# Patient Record
Sex: Female | Born: 1950
Health system: Southern US, Community
[De-identification: ages and names within clinical notes are randomized; demographics above are authoritative.]

## PROBLEM LIST (undated history)

## (undated) DIAGNOSIS — D649 Anemia, unspecified: Secondary | ICD-10-CM

## (undated) DIAGNOSIS — C801 Malignant (primary) neoplasm, unspecified: Secondary | ICD-10-CM

## (undated) DIAGNOSIS — M25559 Pain in unspecified hip: Secondary | ICD-10-CM

## (undated) DIAGNOSIS — J45909 Unspecified asthma, uncomplicated: Secondary | ICD-10-CM

## (undated) DIAGNOSIS — M1990 Unspecified osteoarthritis, unspecified site: Secondary | ICD-10-CM

## (undated) DIAGNOSIS — R32 Unspecified urinary incontinence: Secondary | ICD-10-CM

## (undated) DIAGNOSIS — F419 Anxiety disorder, unspecified: Secondary | ICD-10-CM

## (undated) DIAGNOSIS — I10 Essential (primary) hypertension: Secondary | ICD-10-CM

## (undated) DIAGNOSIS — K219 Gastro-esophageal reflux disease without esophagitis: Secondary | ICD-10-CM

## (undated) HISTORY — PX: OTHER SURGICAL HISTORY: SHX169

## (undated) HISTORY — PX: BUNIONECTOMY: SHX129

## (undated) HISTORY — PX: CARPAL TUNNEL RELEASE: SHX101

## (undated) HISTORY — PX: ABDOMINAL HYSTERECTOMY: SHX81

## (undated) HISTORY — PX: KIDNEY SURGERY: SHX687

## (undated) HISTORY — PX: KIDNEY STONE SURGERY: SHX686

---

## 2004-08-26 ENCOUNTER — Ambulatory Visit: Payer: Self-pay | Admitting: Internal Medicine

## 2005-10-27 ENCOUNTER — Emergency Department: Payer: Self-pay | Admitting: General Practice

## 2006-01-05 ENCOUNTER — Ambulatory Visit: Payer: Self-pay | Admitting: Family Medicine

## 2006-03-12 ENCOUNTER — Emergency Department: Payer: Self-pay | Admitting: General Practice

## 2006-03-12 ENCOUNTER — Other Ambulatory Visit: Payer: Self-pay

## 2007-01-29 ENCOUNTER — Ambulatory Visit: Payer: Self-pay | Admitting: Family Medicine

## 2007-02-08 ENCOUNTER — Ambulatory Visit: Payer: Self-pay | Admitting: Orthopaedic Surgery

## 2007-05-18 ENCOUNTER — Emergency Department: Payer: Self-pay | Admitting: Emergency Medicine

## 2008-03-19 ENCOUNTER — Ambulatory Visit: Payer: Self-pay | Admitting: Physician Assistant

## 2008-12-28 ENCOUNTER — Emergency Department: Payer: Self-pay | Admitting: Emergency Medicine

## 2009-01-24 DIAGNOSIS — C801 Malignant (primary) neoplasm, unspecified: Secondary | ICD-10-CM

## 2009-01-24 HISTORY — DX: Malignant (primary) neoplasm, unspecified: C80.1

## 2009-03-26 ENCOUNTER — Ambulatory Visit: Payer: Self-pay | Admitting: General Practice

## 2010-03-31 ENCOUNTER — Ambulatory Visit: Payer: Self-pay | Admitting: General Practice

## 2011-01-23 ENCOUNTER — Emergency Department: Payer: Self-pay | Admitting: Emergency Medicine

## 2011-02-11 ENCOUNTER — Inpatient Hospital Stay: Payer: Self-pay | Admitting: Internal Medicine

## 2011-02-11 LAB — CBC
HGB: 13.5 g/dL (ref 12.0–16.0)
RBC: 5.43 10*6/uL — ABNORMAL HIGH (ref 3.80–5.20)

## 2011-02-11 LAB — COMPREHENSIVE METABOLIC PANEL
Chloride: 97 mmol/L — ABNORMAL LOW (ref 98–107)
Co2: 27 mmol/L (ref 21–32)
EGFR (African American): 36 — ABNORMAL LOW
EGFR (Non-African Amer.): 30 — ABNORMAL LOW
Osmolality: 279 (ref 275–301)
SGOT(AST): 409 U/L — ABNORMAL HIGH (ref 15–37)
SGPT (ALT): 256 U/L — ABNORMAL HIGH

## 2011-02-11 LAB — LIPASE, BLOOD: Lipase: >3000 U/L (ref 73–393)

## 2011-02-11 LAB — CK TOTAL AND CKMB (NOT AT ARMC)
CK, Total: 466 U/L — ABNORMAL HIGH (ref 21–215)
CK-MB: 1.5 ng/mL (ref 0.5–3.6)

## 2011-02-11 LAB — MAGNESIUM: Magnesium: 1.5 mg/dL — ABNORMAL LOW

## 2011-02-12 LAB — LIPASE, BLOOD: Lipase: >3000 U/L (ref 73–393)

## 2011-02-12 LAB — COMPREHENSIVE METABOLIC PANEL
Alkaline Phosphatase: 77 U/L (ref 50–136)
BUN: 22 mg/dL — ABNORMAL HIGH (ref 7–18)
Bilirubin,Total: 1 mg/dL (ref 0.2–1.0)
Calcium, Total: 8.8 mg/dL (ref 8.5–10.1)
Chloride: 102 mmol/L (ref 98–107)
Creatinine: 1.27 mg/dL (ref 0.60–1.30)
EGFR (African American): 55 — ABNORMAL LOW
Glucose: 130 mg/dL — ABNORMAL HIGH (ref 65–99)
Osmolality: 279 (ref 275–301)
SGPT (ALT): 958 U/L — ABNORMAL HIGH
Sodium: 137 mmol/L (ref 136–145)
Total Protein: 7.1 g/dL (ref 6.4–8.2)

## 2011-02-12 LAB — CBC WITH DIFFERENTIAL/PLATELET
Basophil #: 0 10*3/uL (ref 0.0–0.1)
Basophil %: 0.3 %
Eosinophil #: 0 10*3/uL (ref 0.0–0.7)
Eosinophil %: 0.1 %
Lymphocyte #: 0.8 10*3/uL — ABNORMAL LOW (ref 1.0–3.6)
Lymphocyte %: 10.3 %
MCH: 25.1 pg — ABNORMAL LOW (ref 26.0–34.0)
MCHC: 31.9 g/dL — ABNORMAL LOW (ref 32.0–36.0)
MCV: 79 fL — ABNORMAL LOW (ref 80–100)
Monocyte #: 0.8 10*3/uL — ABNORMAL HIGH (ref 0.0–0.7)
Platelet: 211 10*3/uL (ref 150–440)
RBC: 5.14 10*6/uL (ref 3.80–5.20)

## 2011-02-12 LAB — LIPID PANEL
Cholesterol: 159 mg/dL (ref 0–200)
Ldl Cholesterol, Calc: 107 mg/dL — ABNORMAL HIGH (ref 0–100)
VLDL Cholesterol, Calc: 18 mg/dL (ref 5–40)

## 2011-02-12 LAB — HEMOGLOBIN A1C: Hemoglobin A1C: 6.6 % — ABNORMAL HIGH (ref 4.2–6.3)

## 2011-02-13 LAB — BASIC METABOLIC PANEL
Anion Gap: 9 (ref 7–16)
BUN: 17 mg/dL (ref 7–18)
Calcium, Total: 8.3 mg/dL — ABNORMAL LOW (ref 8.5–10.1)
Chloride: 104 mmol/L (ref 98–107)
Co2: 26 mmol/L (ref 21–32)
Creatinine: 1.14 mg/dL (ref 0.60–1.30)
EGFR (African American): >60
EGFR (Non-African Amer.): 51 — ABNORMAL LOW
Glucose: 99 mg/dL (ref 65–99)
Osmolality: 279 (ref 275–301)
Sodium: 139 mmol/L (ref 136–145)

## 2011-02-13 LAB — CBC WITH DIFFERENTIAL/PLATELET
Basophil #: 0 10*3/uL (ref 0.0–0.1)
Basophil %: 0 %
Eosinophil #: 0 10*3/uL (ref 0.0–0.7)
HCT: 35.5 % (ref 35.0–47.0)
HGB: 11.5 g/dL — ABNORMAL LOW (ref 12.0–16.0)
Lymphocyte %: 8.5 %
MCHC: 32.3 g/dL (ref 32.0–36.0)
Monocyte %: 12.1 %
Neutrophil #: 6.5 10*3/uL (ref 1.4–6.5)
Neutrophil %: 79.4 %
Platelet: 195 10*3/uL (ref 150–440)
RDW: 14.2 % (ref 11.5–14.5)
WBC: 8.2 10*3/uL (ref 3.6–11.0)

## 2011-02-13 LAB — MAGNESIUM: Magnesium: 2.4 mg/dL

## 2011-02-13 LAB — HEPATIC FUNCTION PANEL A (ARMC)
Bilirubin,Total: 2 mg/dL — ABNORMAL HIGH (ref 0.2–1.0)
SGOT(AST): 501 U/L — ABNORMAL HIGH (ref 15–37)
Total Protein: 6.4 g/dL (ref 6.4–8.2)

## 2011-02-13 LAB — LIPASE, BLOOD: Lipase: 1748 U/L — ABNORMAL HIGH (ref 73–393)

## 2011-02-14 LAB — BASIC METABOLIC PANEL
BUN: 17 mg/dL (ref 7–18)
Co2: 24 mmol/L (ref 21–32)
Creatinine: 1.23 mg/dL (ref 0.60–1.30)
EGFR (African American): 57 — ABNORMAL LOW
EGFR (Non-African Amer.): 47 — ABNORMAL LOW
Potassium: 4.2 mmol/L (ref 3.5–5.1)
Sodium: 137 mmol/L (ref 136–145)

## 2011-02-14 LAB — HEPATIC FUNCTION PANEL A (ARMC)
Albumin: 2.6 g/dL — ABNORMAL LOW (ref 3.4–5.0)
Bilirubin, Direct: 1.6 mg/dL — ABNORMAL HIGH (ref 0.00–0.20)
Total Protein: 5.8 g/dL — ABNORMAL LOW (ref 6.4–8.2)

## 2011-02-14 LAB — CBC WITH DIFFERENTIAL/PLATELET
Basophil %: 0.2 %
Eosinophil #: 0 10*3/uL (ref 0.0–0.7)
Eosinophil %: 0.1 %
HCT: 28.9 % — ABNORMAL LOW (ref 35.0–47.0)
Lymphocyte #: 0.9 10*3/uL — ABNORMAL LOW (ref 1.0–3.6)
MCH: 25.6 pg — ABNORMAL LOW (ref 26.0–34.0)
MCHC: 32.5 g/dL (ref 32.0–36.0)
Neutrophil %: 76 %
Platelet: 166 10*3/uL (ref 150–440)
RBC: 3.66 10*6/uL — ABNORMAL LOW (ref 3.80–5.20)
RDW: 14 % (ref 11.5–14.5)
WBC: 9.3 10*3/uL (ref 3.6–11.0)

## 2011-02-15 LAB — HEPATIC FUNCTION PANEL A (ARMC)
Albumin: 2.3 g/dL — ABNORMAL LOW (ref 3.4–5.0)
Alkaline Phosphatase: 86 U/L (ref 50–136)
Bilirubin,Total: 1.1 mg/dL — ABNORMAL HIGH (ref 0.2–1.0)
SGOT(AST): 79 U/L — ABNORMAL HIGH (ref 15–37)
SGPT (ALT): 235 U/L — ABNORMAL HIGH
Total Protein: 5.7 g/dL — ABNORMAL LOW (ref 6.4–8.2)

## 2011-02-16 LAB — IRON AND TIBC
Iron Bind.Cap.(Total): 158 ug/dL — ABNORMAL LOW
Iron Saturation: 15 %
Iron: 24 ug/dL — ABNORMAL LOW
Unbound Iron-Bind.Cap.: 134 ug/dL

## 2011-02-16 LAB — CBC WITH DIFFERENTIAL/PLATELET
Basophil #: 0 10*3/uL (ref 0.0–0.1)
Eosinophil #: 0.2 10*3/uL (ref 0.0–0.7)
HCT: 24.8 % — ABNORMAL LOW (ref 35.0–47.0)
HGB: 8.1 g/dL — ABNORMAL LOW (ref 12.0–16.0)
Lymphocyte #: 1 10*3/uL (ref 1.0–3.6)
MCH: 25.3 pg — ABNORMAL LOW (ref 26.0–34.0)
MCHC: 32.6 g/dL (ref 32.0–36.0)
MCV: 78 fL — ABNORMAL LOW (ref 80–100)
Monocyte #: 1.1 10*3/uL — ABNORMAL HIGH (ref 0.0–0.7)
Neutrophil #: 9.2 10*3/uL — ABNORMAL HIGH (ref 1.4–6.5)
RBC: 3.19 10*6/uL — ABNORMAL LOW (ref 3.80–5.20)
RDW: 13.7 % (ref 11.5–14.5)
WBC: 11.5 10*3/uL — ABNORMAL HIGH (ref 3.6–11.0)

## 2011-02-16 LAB — HEPATIC FUNCTION PANEL A (ARMC)
Albumin: 2.2 g/dL — ABNORMAL LOW (ref 3.4–5.0)
Alkaline Phosphatase: 98 U/L (ref 50–136)
Bilirubin, Direct: 0.4 mg/dL — ABNORMAL HIGH (ref 0.00–0.20)
SGOT(AST): 55 U/L — ABNORMAL HIGH (ref 15–37)

## 2011-02-16 LAB — FERRITIN: Ferritin (ARMC): 1876 ng/mL — ABNORMAL HIGH

## 2011-02-17 LAB — BASIC METABOLIC PANEL
Chloride: 101 mmol/L (ref 98–107)
Co2: 26 mmol/L (ref 21–32)
EGFR (African American): >60

## 2011-02-17 LAB — HEPATIC FUNCTION PANEL A (ARMC)
Albumin: 2.2 g/dL — ABNORMAL LOW (ref 3.4–5.0)
Alkaline Phosphatase: 86 U/L (ref 50–136)
Bilirubin,Total: 0.6 mg/dL (ref 0.2–1.0)
SGOT(AST): 43 U/L — ABNORMAL HIGH (ref 15–37)
SGPT (ALT): 127 U/L — ABNORMAL HIGH

## 2011-02-17 LAB — CBC WITH DIFFERENTIAL/PLATELET
Basophil %: 0.2 %
Eosinophil %: 1.1 %
HGB: 8 g/dL — ABNORMAL LOW (ref 12.0–16.0)
Lymphocyte #: 1.1 10*3/uL (ref 1.0–3.6)
MCH: 25.4 pg — ABNORMAL LOW (ref 26.0–34.0)
MCV: 77 fL — ABNORMAL LOW (ref 80–100)
Monocyte #: 1.4 10*3/uL — ABNORMAL HIGH (ref 0.0–0.7)
Monocyte %: 10.7 %
Neutrophil #: 10 10*3/uL — ABNORMAL HIGH (ref 1.4–6.5)
Neutrophil %: 79.6 %
RBC: 3.13 10*6/uL — ABNORMAL LOW (ref 3.80–5.20)

## 2011-02-17 LAB — URINALYSIS, COMPLETE
Glucose,UR: NEGATIVE mg/dL (ref 0–75)
Ketone: NEGATIVE
Leukocyte Esterase: NEGATIVE
Nitrite: NEGATIVE
Protein: NEGATIVE
RBC,UR: 1 /HPF (ref 0–5)
WBC UR: 1 /HPF (ref 0–5)

## 2011-02-17 LAB — LIPASE, BLOOD: Lipase: 1085 U/L — ABNORMAL HIGH (ref 73–393)

## 2011-02-17 LAB — URINE CULTURE

## 2011-02-18 LAB — IRON AND TIBC
Iron Bind.Cap.(Total): 138 ug/dL — ABNORMAL LOW (ref 250–450)
Iron Saturation: 15 %
Unbound Iron-Bind.Cap.: 117 ug/dL

## 2011-02-18 LAB — CBC WITH DIFFERENTIAL/PLATELET
Basophil #: 0 10*3/uL (ref 0.0–0.1)
Basophil %: 0.2 %
Eosinophil #: 0.3 10*3/uL (ref 0.0–0.7)
Eosinophil %: 2.2 %
HGB: 7.8 g/dL — ABNORMAL LOW (ref 12.0–16.0)
Lymphocyte #: 1.2 10*3/uL (ref 1.0–3.6)
MCH: 25.5 pg — ABNORMAL LOW (ref 26.0–34.0)
MCHC: 32.7 g/dL (ref 32.0–36.0)
MCV: 78 fL — ABNORMAL LOW (ref 80–100)
Neutrophil #: 9.1 10*3/uL — ABNORMAL HIGH (ref 1.4–6.5)
Neutrophil %: 76.6 %
RBC: 3.05 10*6/uL — ABNORMAL LOW (ref 3.80–5.20)
RDW: 14 % (ref 11.5–14.5)

## 2011-02-18 LAB — RETICULOCYTES: Absolute Retic Count: 0.101 10*6/uL — ABNORMAL HIGH (ref 0.024–0.084)

## 2011-02-18 LAB — FERRITIN: Ferritin (ARMC): 1465 ng/mL — ABNORMAL HIGH (ref 8–388)

## 2011-02-18 LAB — AFP TUMOR MARKER: AFP-Tumor Marker: 1.2 ng/mL (ref 0.0–8.3)

## 2011-02-18 LAB — CANCER ANTIGEN 19-9: CA 19-9: 96 U/mL — ABNORMAL HIGH (ref 0–35)

## 2011-02-18 LAB — HEMOGLOBIN: HGB: 8.2 g/dL — ABNORMAL LOW (ref 12.0–16.0)

## 2011-02-18 LAB — FOLATE: Folic Acid: 14.9 ng/mL (ref 3.1–100.0)

## 2011-02-19 LAB — CBC WITH DIFFERENTIAL/PLATELET
Basophil #: 0 10*3/uL (ref 0.0–0.1)
Eosinophil %: 1.5 %
HCT: 24.1 % — ABNORMAL LOW (ref 35.0–47.0)
Lymphocyte #: 1.1 10*3/uL (ref 1.0–3.6)
Lymphocyte %: 9.5 %
MCV: 78 fL — ABNORMAL LOW (ref 80–100)
Monocyte #: 1.1 10*3/uL — ABNORMAL HIGH (ref 0.0–0.7)
Monocyte %: 9.5 %
Neutrophil #: 9.4 10*3/uL — ABNORMAL HIGH (ref 1.4–6.5)
RBC: 3.08 10*6/uL — ABNORMAL LOW (ref 3.80–5.20)
RDW: 13.8 % (ref 11.5–14.5)
WBC: 11.9 10*3/uL — ABNORMAL HIGH (ref 3.6–11.0)

## 2011-02-19 LAB — LIPASE, BLOOD: Lipase: 808 U/L — ABNORMAL HIGH (ref 73–393)

## 2011-02-20 LAB — CBC WITH DIFFERENTIAL/PLATELET
Basophil %: 0.1 %
Eosinophil %: 1.4 %
HCT: 29.1 % — ABNORMAL LOW (ref 35.0–47.0)
HGB: 9.4 g/dL — ABNORMAL LOW (ref 12.0–16.0)
Lymphocyte #: 1.2 10*3/uL (ref 1.0–3.6)
Lymphocyte %: 10.2 %
MCH: 26 pg (ref 26.0–34.0)
MCV: 81 fL (ref 80–100)
Monocyte #: 0.8 10*3/uL — ABNORMAL HIGH (ref 0.0–0.7)
Monocyte %: 6.8 %
Neutrophil %: 81.5 %
Platelet: 291 10*3/uL (ref 150–440)
RBC: 3.62 10*6/uL — ABNORMAL LOW (ref 3.80–5.20)
WBC: 11.4 10*3/uL — ABNORMAL HIGH (ref 3.6–11.0)

## 2011-02-20 LAB — COMPREHENSIVE METABOLIC PANEL
Anion Gap: 10 (ref 7–16)
Bilirubin,Total: 0.5 mg/dL (ref 0.2–1.0)
Chloride: 103 mmol/L (ref 98–107)
Co2: 28 mmol/L (ref 21–32)
Creatinine: 0.95 mg/dL (ref 0.60–1.30)
EGFR (African American): >60
EGFR (Non-African Amer.): >60
Osmolality: 280 (ref 275–301)
Potassium: 3.3 mmol/L — ABNORMAL LOW (ref 3.5–5.1)
SGOT(AST): 46 U/L — ABNORMAL HIGH (ref 15–37)
SGPT (ALT): 77 U/L
Total Protein: 5.9 g/dL — ABNORMAL LOW (ref 6.4–8.2)

## 2011-02-20 LAB — LIPASE, BLOOD: Lipase: 1046 U/L — ABNORMAL HIGH (ref 73–393)

## 2011-02-21 LAB — CULTURE, BLOOD (SINGLE)

## 2011-02-22 LAB — CULTURE, BLOOD (SINGLE)

## 2011-03-02 ENCOUNTER — Inpatient Hospital Stay: Payer: Self-pay | Admitting: Internal Medicine

## 2011-03-02 LAB — URINALYSIS, COMPLETE
Bilirubin,UR: NEGATIVE
Glucose,UR: NEGATIVE mg/dL (ref 0–75)
Ketone: NEGATIVE
Ph: 5 (ref 4.5–8.0)
RBC,UR: 4 /HPF (ref 0–5)
Specific Gravity: 1.014 (ref 1.003–1.030)
Squamous Epithelial: <1

## 2011-03-02 LAB — CK TOTAL AND CKMB (NOT AT ARMC)
CK, Total: 138 U/L (ref 21–215)
CK, Total: 134 U/L (ref 21–215)
CK-MB: 0.6 ng/mL (ref 0.5–3.6)
CK-MB: 0.5 ng/mL (ref 0.5–3.6)

## 2011-03-02 LAB — COMPREHENSIVE METABOLIC PANEL
Anion Gap: 11 (ref 7–16)
BUN: 15 mg/dL (ref 7–18)
Bilirubin,Total: 1.2 mg/dL — ABNORMAL HIGH (ref 0.2–1.0)
Calcium, Total: 10 mg/dL (ref 8.5–10.1)
Creatinine: 1.32 mg/dL — ABNORMAL HIGH (ref 0.60–1.30)
EGFR (African American): 53 — ABNORMAL LOW
EGFR (Non-African Amer.): 43 — ABNORMAL LOW
Glucose: 134 mg/dL — ABNORMAL HIGH (ref 65–99)
Potassium: 4.5 mmol/L (ref 3.5–5.1)
SGPT (ALT): 248 U/L — ABNORMAL HIGH
Sodium: 140 mmol/L (ref 136–145)
Total Protein: 7.5 g/dL (ref 6.4–8.2)

## 2011-03-02 LAB — PROTIME-INR
INR: 1
Prothrombin Time: 13.8 secs (ref 11.5–14.7)

## 2011-03-02 LAB — CBC
HCT: 37.4 % (ref 35.0–47.0)
HGB: 11.9 g/dL — ABNORMAL LOW (ref 12.0–16.0)
MCH: 25.9 pg — ABNORMAL LOW (ref 26.0–34.0)
MCHC: 31.9 g/dL — ABNORMAL LOW (ref 32.0–36.0)
RBC: 4.61 10*6/uL (ref 3.80–5.20)
WBC: 13.2 10*3/uL — ABNORMAL HIGH (ref 3.6–11.0)

## 2011-03-02 LAB — APTT: Activated PTT: 24.9 secs (ref 23.6–35.9)

## 2011-03-02 LAB — TROPONIN I: Troponin-I: <0.02 ng/mL

## 2011-03-03 LAB — CBC WITH DIFFERENTIAL/PLATELET
Basophil #: 0 10*3/uL (ref 0.0–0.1)
Basophil %: 0.6 %
Eosinophil #: 0.1 10*3/uL (ref 0.0–0.7)
HCT: 32.7 % — ABNORMAL LOW (ref 35.0–47.0)
HGB: 10.6 g/dL — ABNORMAL LOW (ref 12.0–16.0)
Lymphocyte #: 0.7 10*3/uL — ABNORMAL LOW (ref 1.0–3.6)
Lymphocyte %: 8.9 %
MCH: 25.9 pg — ABNORMAL LOW (ref 26.0–34.0)
MCHC: 32.4 g/dL (ref 32.0–36.0)
MCV: 80 fL (ref 80–100)
Monocyte #: 0.7 10*3/uL (ref 0.0–0.7)
Monocyte %: 10 %
Neutrophil #: 5.8 10*3/uL (ref 1.4–6.5)
Neutrophil %: 78.6 %
Platelet: 309 10*3/uL (ref 150–440)
RBC: 4.09 10*6/uL (ref 3.80–5.20)
RDW: 16.4 % — ABNORMAL HIGH (ref 11.5–14.5)

## 2011-03-03 LAB — LIPASE, BLOOD: Lipase: >3000 U/L (ref 73–393)

## 2011-03-03 LAB — CK TOTAL AND CKMB (NOT AT ARMC): CK, Total: 106 U/L (ref 21–215)

## 2011-03-03 LAB — MAGNESIUM: Magnesium: 1.7 mg/dL — ABNORMAL LOW

## 2011-03-04 LAB — COMPREHENSIVE METABOLIC PANEL
Alkaline Phosphatase: 189 U/L — ABNORMAL HIGH (ref 50–136)
BUN: 16 mg/dL (ref 7–18)
Calcium, Total: 9.5 mg/dL (ref 8.5–10.1)
Chloride: 106 mmol/L (ref 98–107)
Co2: 27 mmol/L (ref 21–32)
EGFR (African American): 54 — ABNORMAL LOW
EGFR (Non-African Amer.): 45 — ABNORMAL LOW
Potassium: 4.1 mmol/L (ref 3.5–5.1)
SGOT(AST): 435 U/L — ABNORMAL HIGH (ref 15–37)
SGPT (ALT): 468 U/L — ABNORMAL HIGH
Total Protein: 6.6 g/dL (ref 6.4–8.2)

## 2011-03-04 LAB — CBC WITH DIFFERENTIAL/PLATELET
Basophil %: 0.6 %
Eosinophil %: 6.2 %
HCT: 31.3 % — ABNORMAL LOW (ref 35.0–47.0)
HGB: 10.1 g/dL — ABNORMAL LOW (ref 12.0–16.0)
Lymphocyte #: 0.9 10*3/uL — ABNORMAL LOW (ref 1.0–3.6)
MCH: 26 pg (ref 26.0–34.0)
MCV: 80 fL (ref 80–100)
Monocyte #: 0.6 10*3/uL (ref 0.0–0.7)
Monocyte %: 10.2 %
Neutrophil #: 3.6 10*3/uL (ref 1.4–6.5)
Neutrophil %: 65.8 %
RBC: 3.89 10*6/uL (ref 3.80–5.20)
WBC: 5.4 10*3/uL (ref 3.6–11.0)

## 2011-03-04 LAB — LIPASE, BLOOD: Lipase: 2984 U/L — ABNORMAL HIGH (ref 73–393)

## 2011-03-04 LAB — LIPID PANEL
Cholesterol: 127 mg/dL (ref 0–200)
Ldl Cholesterol, Calc: 79 mg/dL (ref 0–100)

## 2011-03-05 LAB — HEPATIC FUNCTION PANEL A (ARMC)
Albumin: 3 g/dL — ABNORMAL LOW (ref 3.4–5.0)
Alkaline Phosphatase: 224 U/L — ABNORMAL HIGH (ref 50–136)
SGOT(AST): 367 U/L — ABNORMAL HIGH (ref 15–37)
SGPT (ALT): 437 U/L — ABNORMAL HIGH

## 2011-03-07 LAB — CBC WITH DIFFERENTIAL/PLATELET
Basophil #: 0.1 10*3/uL (ref 0.0–0.1)
Eosinophil #: 0.4 10*3/uL (ref 0.0–0.7)
Lymphocyte #: 1.6 10*3/uL (ref 1.0–3.6)
MCHC: 32.4 g/dL (ref 32.0–36.0)
MCV: 79 fL — ABNORMAL LOW (ref 80–100)
Monocyte #: 0.4 10*3/uL (ref 0.0–0.7)
Neutrophil #: 1.4 10*3/uL (ref 1.4–6.5)
Platelet: 231 10*3/uL (ref 150–440)
RBC: 3.73 10*6/uL — ABNORMAL LOW (ref 3.80–5.20)
RDW: 16.6 % — ABNORMAL HIGH (ref 11.5–14.5)
WBC: 3.8 10*3/uL (ref 3.6–11.0)

## 2011-03-07 LAB — PROTIME-INR: INR: 1

## 2011-03-07 LAB — LIPASE, BLOOD: Lipase: 675 U/L — ABNORMAL HIGH (ref 73–393)

## 2011-03-08 LAB — COMPREHENSIVE METABOLIC PANEL
BUN: 6 mg/dL — ABNORMAL LOW (ref 7–18)
Bilirubin,Total: 0.9 mg/dL (ref 0.2–1.0)
Calcium, Total: 8.6 mg/dL (ref 8.5–10.1)
Chloride: 104 mmol/L (ref 98–107)
Creatinine: 1.05 mg/dL (ref 0.60–1.30)
EGFR (African American): >60
EGFR (Non-African Amer.): 56 — ABNORMAL LOW
Potassium: 3.3 mmol/L — ABNORMAL LOW (ref 3.5–5.1)
SGPT (ALT): 359 U/L — ABNORMAL HIGH
Sodium: 138 mmol/L (ref 136–145)
Total Protein: 6.3 g/dL — ABNORMAL LOW (ref 6.4–8.2)

## 2011-03-08 LAB — LIPASE, BLOOD: Lipase: 1855 U/L — ABNORMAL HIGH (ref 73–393)

## 2011-03-09 LAB — COMPREHENSIVE METABOLIC PANEL
Alkaline Phosphatase: 188 U/L — ABNORMAL HIGH (ref 50–136)
Anion Gap: 12 (ref 7–16)
BUN: 5 mg/dL — ABNORMAL LOW (ref 7–18)
Bilirubin,Total: 0.8 mg/dL (ref 0.2–1.0)
Co2: 26 mmol/L (ref 21–32)
Creatinine: 0.81 mg/dL (ref 0.60–1.30)
EGFR (Non-African Amer.): >60
Osmolality: 279 (ref 275–301)
SGOT(AST): 145 U/L — ABNORMAL HIGH (ref 15–37)
Sodium: 142 mmol/L (ref 136–145)

## 2011-03-09 LAB — LIPASE, BLOOD: Lipase: 449 U/L — ABNORMAL HIGH (ref 73–393)

## 2011-04-05 ENCOUNTER — Ambulatory Visit: Payer: Self-pay | Admitting: General Practice

## 2012-04-17 ENCOUNTER — Ambulatory Visit: Payer: Self-pay | Admitting: Medical

## 2013-02-26 IMAGING — CR DG CHEST 2V
1 series · 4 of 4 positions shown · non-contrast
Comparison: none

REASON FOR EXAM: For elevated of temperature
COMMENTS:

[Series 1: x chest ap · 0.14mm/px · 4 of 4 slices shown]
[im 1/4]
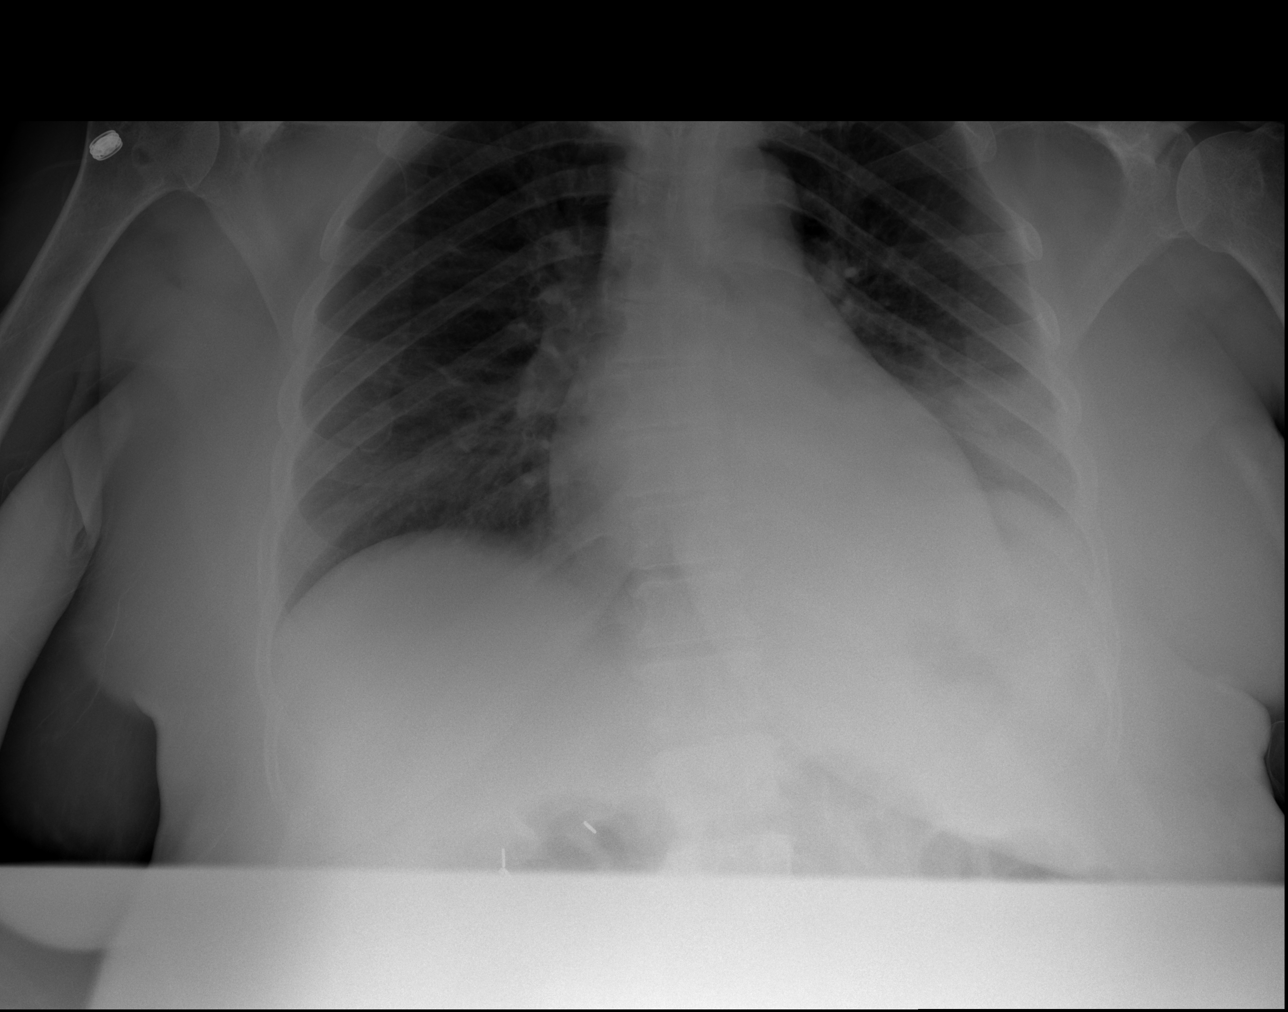
[im 2/4]
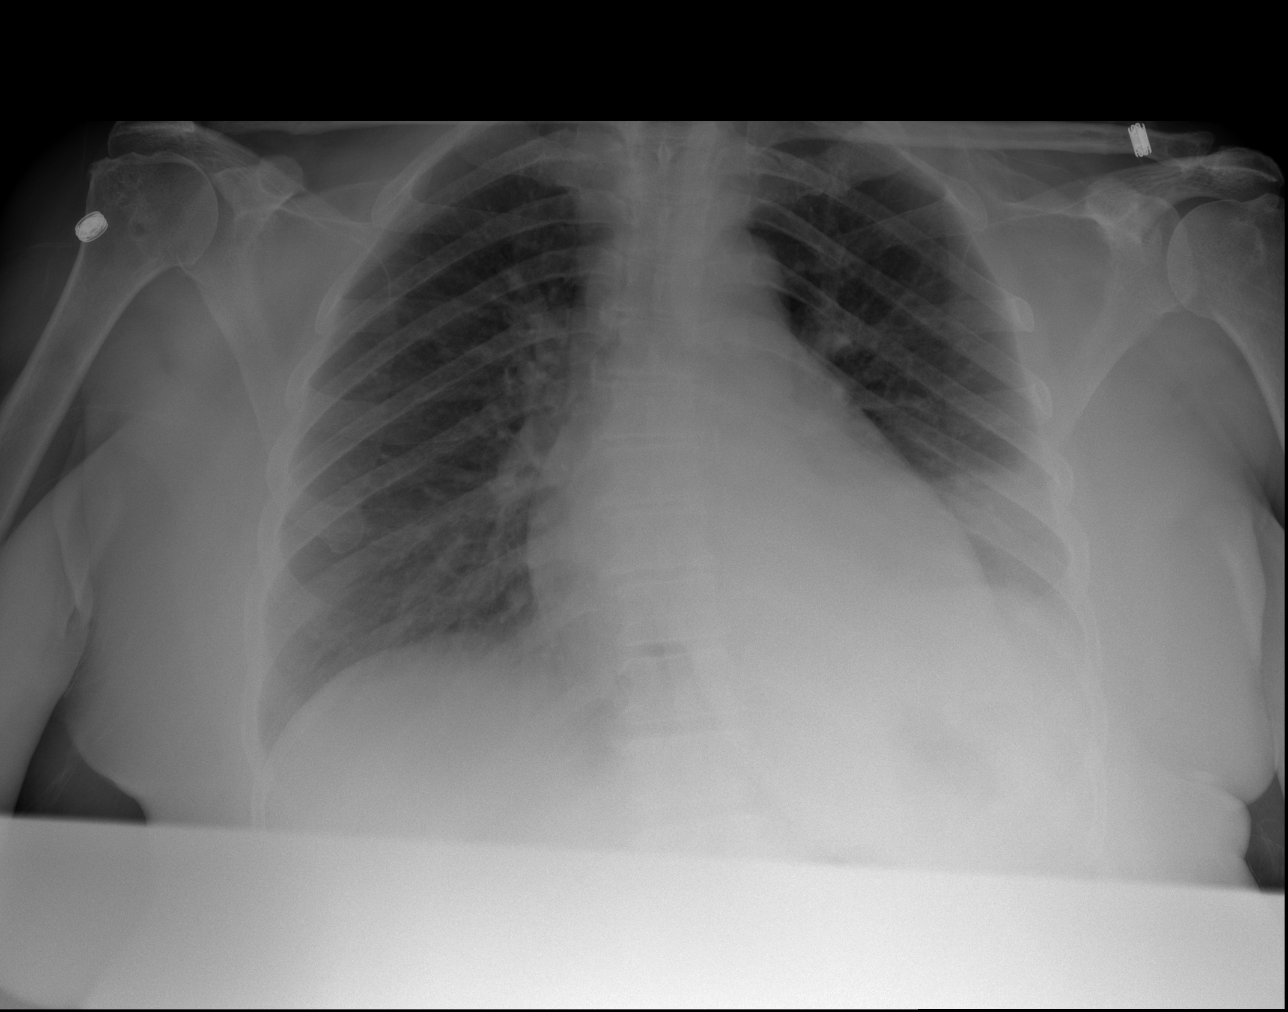
[im 3/4]
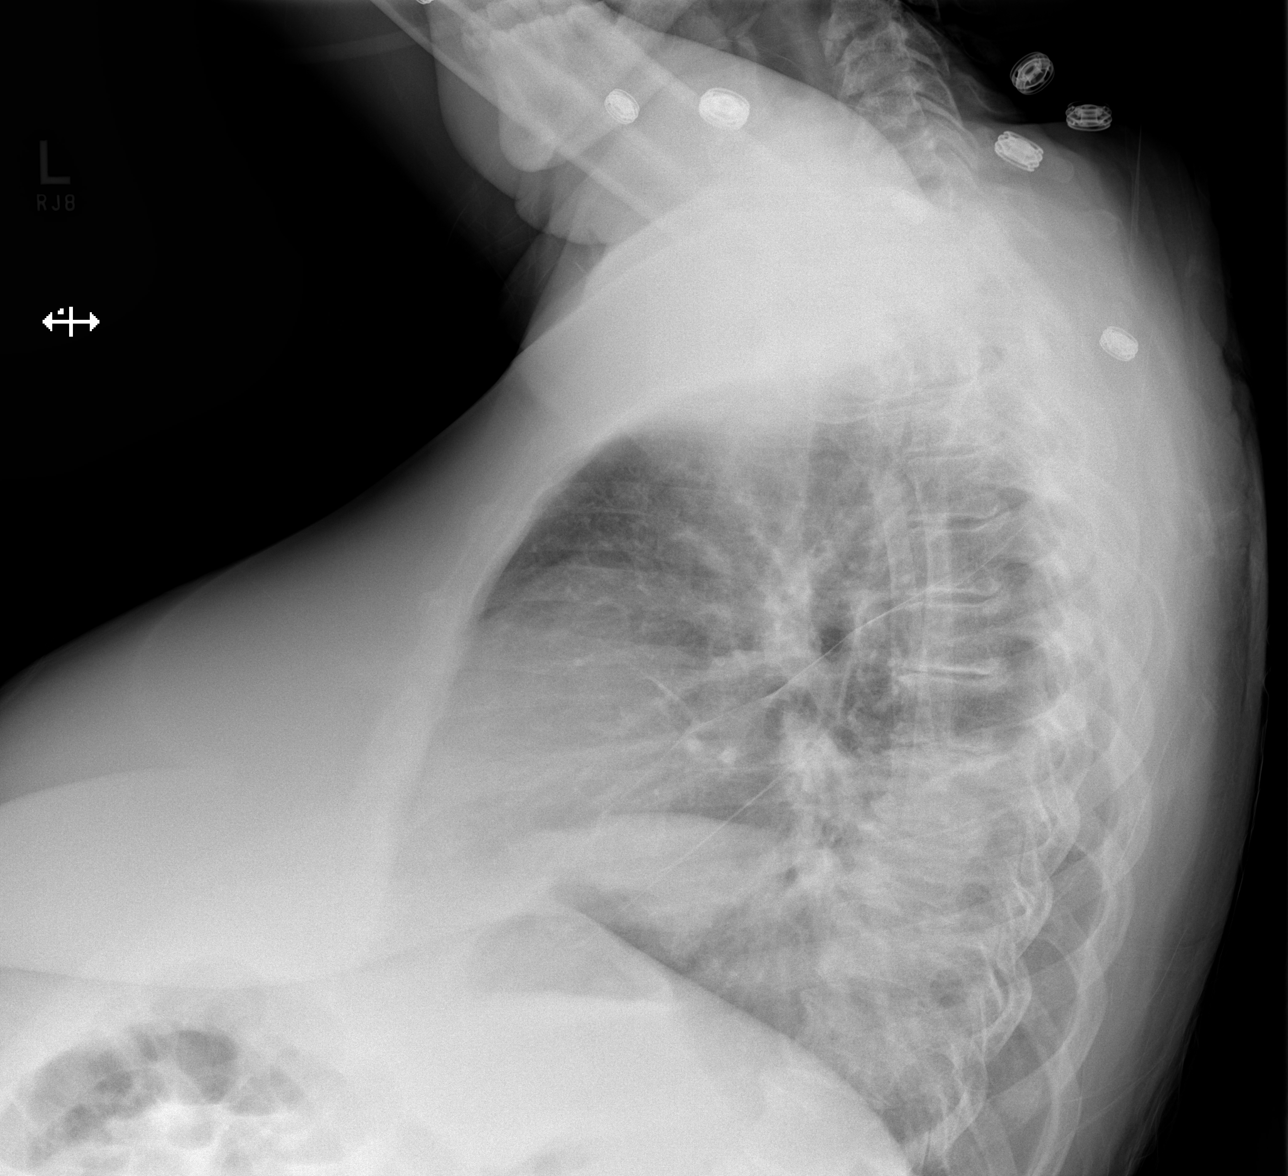
[im 4/4]
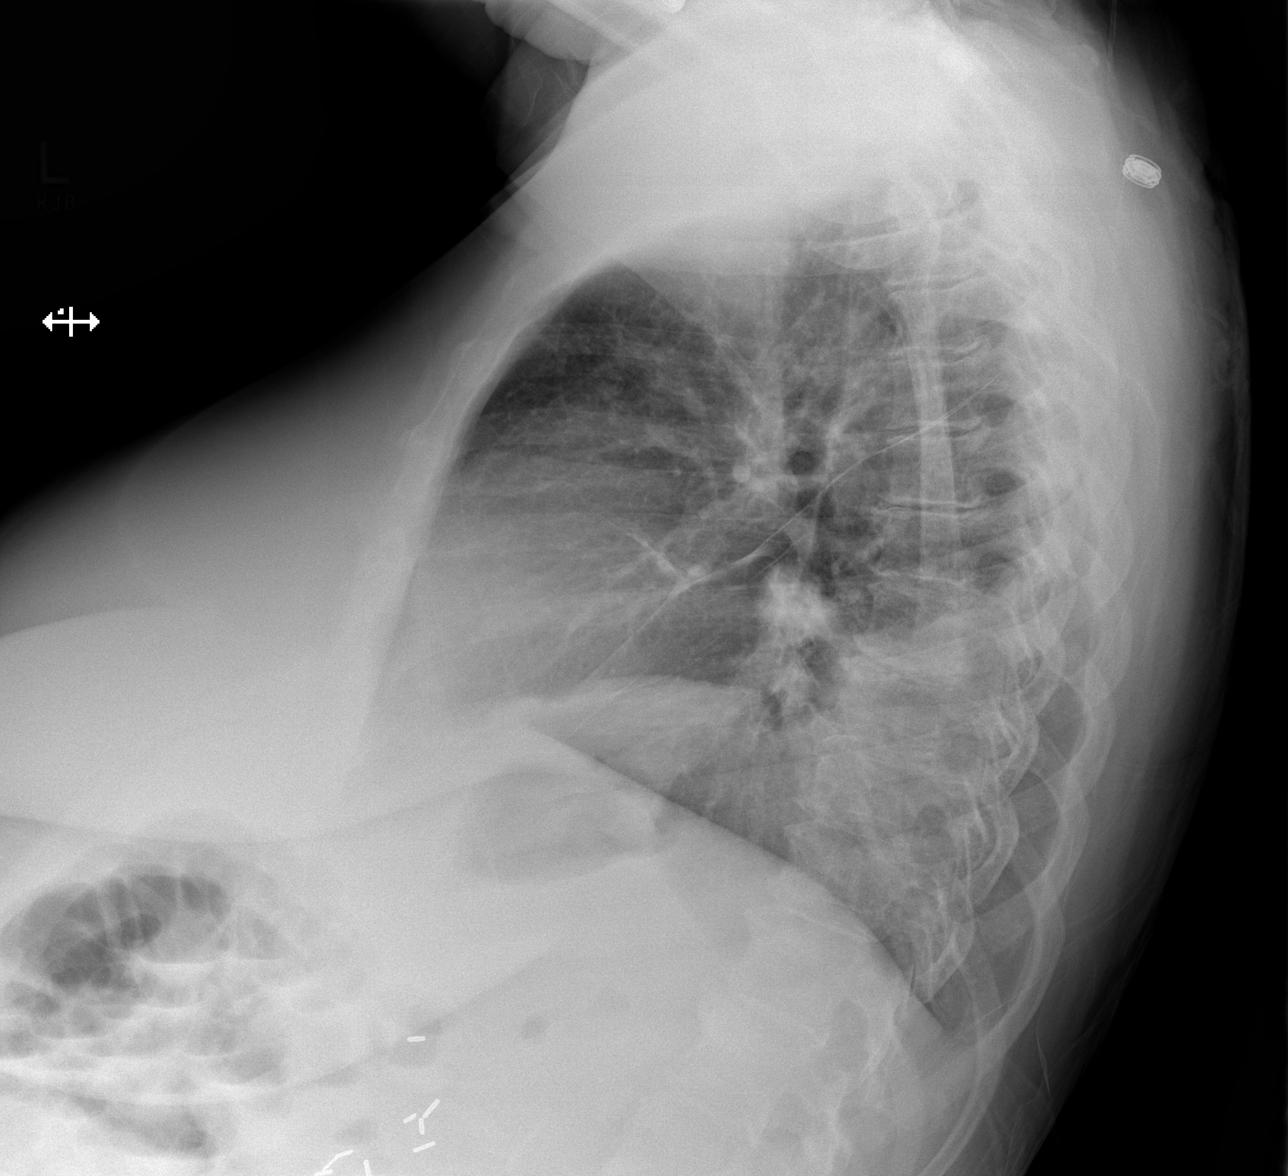

[4 of 4 positions shown; findings below may reference images not displayed]

PROCEDURE:     DXR - DXR CHEST PA (OR AP) AND LATERAL  - February 17, 2011  [DATE]

RESULT:     Comparison is made to the previous examination dated 11 February, 2011.

Is increased density in the left lower lobe consistent with pneumonia.
Pulmonary vascular congestion is present. The heart is at the upper limits
of normal in size.
IMPRESSION: Left lower lobe pneumonia.

## 2013-08-06 ENCOUNTER — Ambulatory Visit: Payer: Self-pay | Admitting: Internal Medicine

## 2014-04-04 DIAGNOSIS — G8929 Other chronic pain: Secondary | ICD-10-CM | POA: Diagnosis not present

## 2014-04-04 DIAGNOSIS — M549 Dorsalgia, unspecified: Secondary | ICD-10-CM | POA: Diagnosis not present

## 2014-05-18 NOTE — Consult Note (Signed)
Chief Complaint:   Subjective/Chief Complaint denies abd pain or nausea, wants to go home. tolerating po clears.   VITAL SIGNS/ANCILLARY NOTES: **Vital Signs.:   10-Feb-13 14:24   Vital Signs Type Routine   Temperature Temperature (F) 98.6   Celsius 37   Temperature Source oral   Pulse Pulse 64   Pulse source per Dinamap   Respirations Respirations 20   Systolic BP Systolic BP 829   Diastolic BP (mmHg) Diastolic BP (mmHg) 68   Mean BP 96   BP Source Dinamap   Pulse Ox % Pulse Ox % 97   Pulse Ox Activity Level  At rest   Oxygen Delivery Room Air/ 21 %  *Intake and Output.:   10-Feb-13 08:50   Stool  Medium brown    13:20   Stool  pt had a med formed stool   Brief Assessment:   Cardiac Regular    Respiratory clear BS    Gastrointestinal details normal Soft  Nontender  Nondistended  No masses palpable  Bowel sounds normal  No rebound tenderness   Routine Chem:  07-Feb-13 04:31    Lipase > 3000  08-Feb-13 04:15    Lipase 2984  09-Feb-13 04:31    Lipase 1121  10-Feb-13 05:48    Lipase 486   Radiology Results: MRI:    07-Feb-13 15:26, MRCP MR Cholangiogram   MRCP MR Cholangiogram    REASON FOR EXAM:    Recurrent pancreatitis, transaminitis, pt does not   have metal in ears, had ct he  COMMENTS:       PROCEDURE: MR  - MR CHOLANGIOGRAM  - Mar 03 2011  3:26PM     RESULT: Comparison: CT of the abdomen and pelvis 02/16/2011    Technique: Standard M.R.C.P. protocol, without intravenous contrast.    Findings:  The extrahepatic bile duct is dilated to at or just proximal to the level   of the ampulla. It measures 14 mm in diameter. No intraluminal filling   defect identified. The cystic duct remnant is dilated. The intrahepatic   biliary ducts are dilated. The main pancreatic duct is dilated, measuring     3-4 mm. No definite pancreatic head mass seen on this noncontrast study.   The patient is status post cholecystectomy.     There are no findings on these T2  weighted images to correlate with the   large area of ill-defined abnormality in the right hepatic lobe seen on   the recent prior abdominal CT. However, evaluation is limited by lack of   intravenous contrast as the study was tailored for only M.R.C.P. There   are a few tiny subcentimeter foci of T2 hyperintensity in the inferior   right hepatic lobe which are too small to characterize. Ill-defined fluid   collections are seen adjacent to the tail of the pancreas and the spleen,   which are similar to prior given differences in technique.    The left kidney is absent. T2 hyperintense structure in the right kidney   is incompletely characterized, but likely represents a cyst.    IMPRESSION:   1. The intrahepaticand extrahepatic bile ducts, as well as the   pancreatic duct, are dilated and seen to the level at or just proximal to   the ampulla. No intraluminal filling defect identified. If there is   continued clinical concern regarding a cause of obstruction in the region   of the ampulla, further evaluation could be provided with ERCP.  2. No findings seen  to correlate with the large area of abnormality in   the right hepatic lobe on the recent prior CT. Evaluation is limited by   lack of intravenous contrast. Further evaluation could be provided with   contrast-enhanced abdominal MRI or potentially followup contrast-enhanced   CT.  3. Ill-defined fluid collections adjacent to the pancreatic tail are   likely similar to the prior CT given differences in technique. These are   nonspecific and possibly sequela pancreatitis.      Verified By: Gregor Hams, M.D., MD   Assessment/Plan:  Assessment/Plan:   Assessment 1) pancreatitis, abnormal MRCP, elevated ca 19-9.  concern for ampullary mass. lipase continuing to improve.    Plan 1) will arrange ercp for tomorrow with Dr Candace Cruise.   Electronic Signatures: Loistine Simas (MD)  (Signed 10-Feb-13 16:15)  Authored: Chief Complaint,  VITAL SIGNS/ANCILLARY NOTES, Brief Assessment, Lab Results, Radiology Results, Assessment/Plan   Last Updated: 10-Feb-13 16:15 by Loistine Simas (MD)

## 2014-05-18 NOTE — Consult Note (Signed)
Pt had temp spike last night, labs much better.  non tender abdomen, chest clear ? decreased breath sounds at base.  Will give incentive spironometry if not already on it.  If temp spikes again consider repeat CT scan.  Electronic Signatures: Manya Silvas (MD)  (Signed on 22-Jan-13 17:47)  Authored  Last Updated: 22-Jan-13 17:47 by Manya Silvas (MD)

## 2014-05-18 NOTE — Consult Note (Signed)
Pt voiding ok today, percussion shows no bladder distention.  CT without pseudocyst or necrosis but lipase still up to 817, SGPT 167, SGOT 55.  T max yesterday 100.7, today afebrile.  Pt wants to eat, uncertain how fast to advance diet due to CT showing continued peri- pancreatic inflammation and a strange area in R hepatic lobe either cysts or dilated ducts.  TB down to 0.8.  Could try low fat diet tomorrow or wait til Friday. No need for ERCP at present.  Electronic Signatures: Manya Silvas (MD)  (Signed on 23-Jan-13 17:14)  Authored  Last Updated: 23-Jan-13 17:14 by Manya Silvas (MD)

## 2014-05-18 NOTE — Consult Note (Signed)
Pancreatitis likely from gall stone, LFT's rising, lipase still up, somewhat dilated hepatic ducts.  She was started on HCTZ a week ago and may have pancreatitis from this although this is rare.  Suggest to increase her morphine to every 2 hours as needed, get MRCP to check for CBD stone and may need ERCP if has one. Will follow with you.  Electronic Signatures: Manya Silvas (MD)  (Signed on 19-Jan-13 08:25)  Authored  Last Updated: 19-Jan-13 08:25 by Manya Silvas (MD)

## 2014-05-18 NOTE — Consult Note (Signed)
Chief Complaint:   Subjective/Chief Complaint LFT's almost normal. CT without CBD stone. Mild biliary dilation is probably due to prior cholecystectomy. ? hypodense area in liver. No evidence of pancreatic necrosis. Discussed with Dr. Vira Agar. No indication for an ERCP at this time. Will sign off. Dr. Vira Agar will continue to follow.   Electronic Signatures: Jill Side (MD)  (Signed 24-Jan-13 12:23)  Authored: Chief Complaint   Last Updated: 24-Jan-13 12:23 by Jill Side (MD)

## 2014-05-18 NOTE — Discharge Summary (Signed)
PATIENT NAME:  Crystal Bailey, Crystal Bailey MR#:  [id number] DATE OF BIRTH:  1950/09/29  DATE OF ADMISSION:  02/11/2011 DATE OF DISCHARGE:  02/20/2011  Please also see interim discharge summary dictated by Dr. Bridgette Habermann on 02/18/2011.   DIAGNOSES:  1. Acute pancreatitis with possible cholangitis, possible choledocholithiasis. 2. Transaminitis. 3. Liver hypodensity of unknown etiology.   4. Pneumonia.  5. Acute renal failure, resolved. 6. Hypertension. 7. Chronic obstructive pulmonary disease, stable. 8. Hyperglycemia.  9. Elevated D-dimer.  10. Anemia, likely of chronic disease.   DISPOSITION: The patient is being discharged home.   FOLLOW-UP: Follow-up with Dr. Vira Agar, Dr. Inez Pilgrim, and Dr. Brunetta Genera in 1 to 2 weeks.   DIET: Low sodium, ADA, low fat, low cholesterol diet.   DISCHARGE MEDICATIONS:  1. Levaquin 750 mg daily for five days. 2. Norvasc 10 mg daily.  3. Omeprazole 20 mg daily.  4. Advair 250/50 1 puff b.i.d.  5. Vitamin D 1000 international units daily.  6. Iron 28 mg 1 tablet daily.  7. VESIcare 5 mg daily.  8. Flexeril 10 mg p.r.n.  9. Meloxicam 7.5 mg b.i.d.  10. Tylenol p.r.n.  11. Cascade apply to affected area as needed.   LABORATORY, DIAGNOSTIC, AND RADIOLOGICAL DATA: White count 11.9 to 11.4, hemoglobin 10.5 to 9.4 after 1 unit of blood. Platelet count normal. Lipase 1046. The patient was asymptomatic, almost normalization of LFTs. Potassium 3.3, which was supplemented.   HOSPITAL COURSE: For additional details, please see the interim discharge summary dictated by Dr. Bridgette Habermann on 02/18/2011. This discharge summary covers the patient's hospital course from January 26th to January 27th.   The patient was started on a full liquid diet and then transitioned to a low fat diet which she tolerated without any difficulty. Her lipase did go up to 1046, however, the patient was completely asymptomatic. The patient was evaluated by Dr. Candace Cruise who was covering for Dr. Vira Agar over the  weekend and he recommended no additional work-up and discharge with outpatient follow-up with Dr. Vira Agar. Since the patient had liver hypodensities in the liver lobe, tumor markers were sent out. The patient's CEA and AFP were negative. However, she had mildly elevated CA-19-9. She was evaluated by Oncology and recommended outpatient follow-up. No additional work-up was ordered. During the hospitalization, the patient's LFTs have essentially normalized. She has been afebrile. Her acute renal failure has resolved. Her hypertension and chronic obstructive pulmonary disease have been well controlled. The patient had hyperglycemia. Hemoglobin A1c was 6.6. She has been advised about dietary control. There was a consistent fall in the patient's hemoglobin during the hospitalization. Iron studies had shown anemia of chronic disease. She received 1 unit of blood transfusion during the hospitalization and her hemoglobin is 9.4. The patient was strongly desirous of going home and she was cleared for discharge by GI. She is being discharged home in a stable condition. She has been aggressively counseled about following up with the specialists as an outpatient.   TIME SPENT: 45 minutes.   ____________________________ Cherre Huger, MD sp:drc D: 02/20/2011 15:34:27 ET T: 02/21/2011 10:32:05 ET JOB#: [id number]  cc: Cherre Huger, MD, <Dictator> Cherre Huger MD ELECTRONICALLY SIGNED 02/21/2011 15:11

## 2014-05-18 NOTE — Consult Note (Signed)
Pt not back from MRCP. Please see Dawn Harrison's notes. Recurrent pancreatitis. If MRCP clearly shows CBD stone, then patient will need ERCP eventually. Typically, we wait for pancreatitis to improve before scheduling ERCP. Will check back on patient tomorrow. Thanks.  Electronic Signatures: Verdie Shire (MD)  (Signed on 07-Feb-13 15:13)  Authored  Last Updated: 07-Feb-13 15:13 by Verdie Shire (MD)

## 2014-05-18 NOTE — Consult Note (Signed)
Due to rising LFT's and pancreatitis I am concerned about possible early ascending cholangitis and will place her on antibiotic  coverage.  Electronic Signatures: Manya Silvas (MD)  (Signed on 19-Jan-13 15:17)  Authored  Last Updated: 19-Jan-13 15:17 by Manya Silvas (MD)

## 2014-05-18 NOTE — Consult Note (Signed)
Chief Complaint:   Subjective/Chief Complaint minimal abdominal pain, denies nausea.   VITAL SIGNS/ANCILLARY NOTES: **Vital Signs.:   09-Feb-13 13:10   Vital Signs Type Routine   Temperature Temperature (F) 98.4   Celsius 36.8   Temperature Source oral   Pulse Pulse 59   Pulse source per Dinamap   Respirations Respirations 18   Systolic BP Systolic BP 361   Diastolic BP (mmHg) Diastolic BP (mmHg) 59   Mean BP 86   BP Source Dinamap   Pulse Ox % Pulse Ox % 97   Pulse Ox Activity Level  At rest   Oxygen Delivery Room Air/ 21 %   Brief Assessment:   Cardiac Regular    Respiratory clear BS    Gastrointestinal details normal Soft  Nondistended  No masses palpable  Bowel sounds normal  No rebound tenderness  No gaurding  mild tenderness eipgastrum toward luq.   Hepatic:  09-Feb-13 04:31    Bilirubin, Total 1.6   Alkaline Phosphatase 224   SGPT (ALT) 437   SGOT (AST) 367   Total Protein, Serum 6.7   Albumin, Serum 3.0  Routine Chem:  09-Feb-13 04:31    Lipase 1121  Hepatic:  09-Feb-13 04:31    Bilirubin, Direct 1.0   Assessment/Plan:  Assessment/Plan:   Assessment 1) dilated bile and pancreatic ducts, elevated ca19-9, possible ampullary mass.  2) pancreatitis-#1.    Plan 1) plans for ercp monday if pancreatitis improving.  lipase improving, pain much improved. agree with trial of clears.  following   Electronic Signatures: Loistine Simas (MD)  (Signed 09-Feb-13 17:58)  Authored: Chief Complaint, VITAL SIGNS/ANCILLARY NOTES, Brief Assessment, Lab Results, Assessment/Plan   Last Updated: 09-Feb-13 17:58 by Loistine Simas (MD)

## 2014-05-18 NOTE — Consult Note (Signed)
[place] with some dilated ducts, maybe due to gall bladder out but may be due to CBD stone.  Her bilirubin has been slowly rising even while her ALT and AST have fallen.  Since we cannot do an MRCP due to possible cochlear implant I think she needs an ERCP and Dr. Lance Coon will contact Dr. Dionne Milo about this since Dr. Candace Cruise is not here for a few days.  Abd less tender today, no new complaints.  Electronic Signatures: Manya Silvas (MD)  (Signed on 21-Jan-13 13:58)  Authored  Last Updated: 21-Jan-13 13:58 by Manya Silvas (MD)

## 2014-05-18 NOTE — Consult Note (Signed)
Chief Complaint:   Subjective/Chief Complaint Covering for Dr. Vira Agar. No abd pain or nausea. Tolerating liquid diet. Lipase gradually falling.   VITAL SIGNS/ANCILLARY NOTES: **Vital Signs.:   26-Jan-13 05:47   Vital Signs Type Routine   Temperature Temperature (F) 99.2   Celsius 37.3   Temperature Source oral   Pulse Pulse 70   Pulse source per Dinamap   Respirations Respirations 18   Systolic BP Systolic BP 235   Diastolic BP (mmHg) Diastolic BP (mmHg) 66   Mean BP 90   BP Source Dinamap   Pulse Ox % Pulse Ox % 92   Pulse Ox Activity Level  At rest   Oxygen Delivery Room Air/ 21 %   Brief Assessment:   Cardiac Regular    Respiratory crackles on left lung base   Routine Hem:  26-Jan-13 04:41    WBC (CBC) 11.9   RBC (CBC) 3.08   Hemoglobin (CBC) 7.8   Hematocrit (CBC) 24.1   Platelet Count (CBC) 278   MCV 78   MCH 25.2   MCHC 32.2   RDW 13.8  Routine Chem:  26-Jan-13 04:41    Lipase 808  Routine Hem:  26-Jan-13 04:41    Neutrophil % 79.3   Lymphocyte % 9.5   Monocyte % 9.5   Eosinophil % 1.5   Basophil % 0.2   Neutrophil # 9.4   Lymphocyte # 1.1   Monocyte # 1.1   Eosinophil # 0.2   Basophil # 0.0   Assessment/Plan:  Assessment/Plan:   Assessment Pancreatitis. Resolving. Pneumonia. On Abx. Microcytic anemia with falling hgb.    Plan Low fat, low chol diet ordered. Stool for heme ordered. Consider blood transfusion.   Electronic Signatures: Verdie Shire (MD)  (Signed 26-Jan-13 10:01)  Authored: Chief Complaint, VITAL SIGNS/ANCILLARY NOTES, Brief Assessment, Lab Results, Assessment/Plan   Last Updated: 26-Jan-13 10:01 by Verdie Shire (MD)

## 2014-05-18 NOTE — H&P (Signed)
PATIENT NAME:  Crystal Bailey, Crystal Bailey MR#:  [id number] DATE OF BIRTH:  [birth year]  DATE OF ADMISSION:  03/02/2011  PRIMARY CARE PHYSICIAN: Lorelee Market, MD   ER REFERRING PHYSICIAN: Ponciano Ort, MD    CHIEF COMPLAINT: Epigastric pain.   HISTORY OF PRESENT ILLNESS: The patient is a 64 year old female with past medical history of chronic obstructive pulmonary disease, osteoarthritis, hypertension, iron deficiency and vitamin D deficiency, who had a prolonged hospital course recently from 1/18 to 02/20/2011 for acute pancreatitis and transaminitis. The patient was discharged home with a lipase level of 104 segs; however, she was completely asymptomatic with that lipase level and was tolerating a low-fat diet. She returns today with sudden onset of epigastric pain which started at about 7:00 a.m. this morning. She denies any nausea, vomiting, has been constipated. She has seen Dr. Brunetta Genera about a week ago after discharge from the hospital. She is scheduled to see Dr. Vira Agar in four days and to see Dr. Inez Pilgrim next week. During the patient's last hospitalization, she had extensive work-up including CT scan of the abdomen which showed only evidence of pancreatitis. There was no evidence of pseudocyst or necrosis. She is nonalcoholic. Her fasting lipid profile/triglycerides were within normal limits. An ultrasound showed some intra and extrahepatic ductal dilatation. The patient is status post cholecystectomy. There was a possibility of common bile duct stones; however, the patient reported that she had some metal in her ear, therefore, MRCP could not be done. She also did not have an ERCP. She was found to have some hypodensities in her right hepatic lobe, and tumor markers were checked. Her CEA and AFP were normal. She had a very mildly elevated CA-19-9. She was seen by Oncology. There was no clinical suspicion for any malignancy; so, therefore, no additional work-up was ordered. The patient was scheduled to  follow up with Dr. Inez Pilgrim as an outpatient since she also had a history of renal cell cancer and has undergone a nephrectomy. She had acute renal failure on presentation at that time which had resolved with IV fluid hydration. She had mild hyperglycemia, however, her hemoglobin A1c was 6.6. She declined blood transfusion, but her iron studies were consistent with anemia of iron deficiency.   ALLERGIES: No known drug allergies.    PAST MEDICAL HISTORY:  1. Recent hospitalization for pancreatitis. Details are in history of present illness. 2. Hypertension. 3. Osteoarthritis. 4. Chronic obstructive pulmonary disease/asthma.  5. Iron deficiency anemia. 6. Vitamin D deficiency.   MEDICATIONS:  1. Norvasc 10 mg daily.  2. Omeprazole 20 mg daily.  3. Advair 250/50, 1 puff b.i.d.  4. Vitamin D 1000 international units daily.  5. Iron 28 mg daily.  6. VESIcare 5 mg daily.  7. Paxil 10 mg t.i.d. p.r.n.  8. Meloxicam 7.5 mg b.i.d.  9. Tylenol p.r.n.  10. Cascade apply to affected area as needed.   PAST SURGICAL HISTORY:  1. Cholecystectomy.  2. Left nephrectomy for renal cell cancer.  3. Colonoscopy normal in the past.   4. Carpal tunnel release.   5. Partial hysterectomy. 6. Right foot surgery for bunion.   SOCIAL HISTORY: The patient smokes very occasionally, maybe 1 to 2 cigarettes per day. She denies any alcohol or drug abuse. She lives with her daughter.   FAMILY HISTORY: Mother had breast cancer and diabetes. The patient does not remember her father.    REVIEW OF SYSTEMS: CONSTITUTIONAL: Denies any fever, weakness, fatigue. EYES: Denies any blurred or double vision. ENT: Denies any tinnitus,  ear pain. RESPIRATORY: Denies any painful respiration, wheezing, cough. CARDIOVASCULAR: Denies any chest pain, palpitation, has hypertension. GASTROINTESTINAL: Denies any nausea, vomiting, diarrhea. Has epigastric. GENITOURINARY: Denies any nocturia or pyuria. MUSCULOSKELETAL: Has osteoarthritis.  Denies any swelling or gout. INTEGUMENTARY: Denies any acne, rashes or eruptions. NEUROLOGICAL: Denies any fainting spells, blackouts. PSYCHIATRIC: Denies any insomnia, depression, headaches. ENDOCRINE: Denies any thyroid problems, increased sweating. HEME/LYMPH: Denies any anemia or easy bruisability.   PHYSICAL EXAMINATION:  VITAL SIGNS: Temperature 97.1, heart rate 72, respiratory rate 18, blood pressure 99/55, pulse oximetry 98% on room air.   GENERAL: The patient is a 64 year old African American female who is lying in bed. She is in moderate distress and is tearful, She reports that she does not want a prolonged hospitalization.   HEENT: Head: Atraumatic, normocephalic. Eyes: Mild pallor. No icterus or cyanosis. Pupils are equal, round, and reactive to light and accommodation. Extraocular movements intact. ENT: Wet mucous membranes. No oropharyngeal erythema or thrush.   NECK: Supple. No masses. No JVD. No thyromegaly. No lymphadenopathy.   CHEST WALL: No tenderness to palpation. Not using accessory muscles of respiration. No intercostal muscle retractions.   LUNGS: A few basilar crepitations. No rhonchi or wheezing.   CARDIOVASCULAR: S1, S2 regular. No murmurs, gallops.   ABDOMEN: Soft, no guarding, no rigidity, no distention, Hyperactive bowel sounds. There is tenderness to palpation in the epigastric region. No organomegaly.   SKIN: No rashes or lesions.   PERIPHERIES: No pedal edema. 2+ pedal pulses.   MUSCULOSKELETAL: No cyanosis or clubbing.   NEUROLOGICAL: Awake, alert, and oriented x3. Nonfocal neurological exam. Cranial nerves are grossly intact.   PSYCHIATRIC: Normal mood and affect.   LABORATORY, DIAGNOSTIC AND RADIOLOGICAL DATA:  Cardiac enzymes negative.  Urinalysis shows no evidence of infection.  Chest x-ray shows no acute cardiopulmonary pathology.  White count 13.2, hemoglobin 11.9, hematocrit 37.4, platelets 378.  Glucose 134, BUN 15, creatinine 1.32,  elevated bilirubin 1.2, ALT 248, AST 507. Lipase more than 3000.   ASSESSMENT AND PLAN: A 64 year old female with past medical history of hypertension, chronic obstructive pulmonary disease, presents with recurrent pancreatitis.   1. Recurrent pancreatitis with transaminitis: We will keep the patient n.p.o. except meds and ice chips. We will start her on IV fluids, p.r.n. analgesics and antiemetics. The patient is status post cholecystectomy. During her last admission, her fasting lipid profile/triglycerides were normal. There is a possibility that the patient has common bile duct stones. An M.R.C.P. was not done during the last admission because of the possibility of metal in the ear. I discussed with Dr. Posey Pronto, radiologist, who recommended a CT scan to look for metal in the ear. It appears that the patient had a CAT scan of the head in April of 2009 which did not show any metal. The patient reports that she had ear surgery more than 20 years ago at [facility]. Over the years, she has not had any surgeries since 2009. We will order MRCP since the patient did not have any metal in her body/head on CAT scan done in April of 2009. We will also get a GI evaluation.  2. Acute renal failure: The patient has only one kidney. In the past, her renal failure has resolved with fluids. We will hydrate with fluids and recheck creatinine in the a.m. Monitor strict ins and outs, avoid nephrotoxic medications.  3. Anemia: The patient's hemoglobin is better than when she was discharged home. Her iron studies during her last admission had shown anemia  of chronic disease.  4. Leukocytosis: Likely acute stress reaction due to pancreatitis.  5. Hyperglycemia: The patient's hemoglobin A1c was checked during the last admission and was 6.6.  6. Elevated liver function tests: We will monitor closely. We will order an MRCP to rule out any common bile duct stones. During her last admission, hepatitis profile was checked, and  the patient's hepatitis A antibody was positive. The rest of the panel was negative.  7. Hypertension: The patient's blood pressure is currently low. We will hold antihypertensive medications.  8. Chronic obstructive pulmonary disease: We will continue Advair. There is no evidence of wheezing at present. During the last hospitalization, the patient was treated for pneumonia.  9. Prophylaxis: We will place on GI and deep venous thrombosis prophylaxis.   I reviewed old medical records, discussed with the ED physician, discussed with the patient and her daughter the plan of care and management.   TIME SPENT: 75 minutes.  ____________________________ Cherre Huger, MD sp:cbb D: 03/02/2011 18:04:49 ET T: 03/02/2011 18:51:34 ET JOB#: [id number]  cc: Cherre Huger, MD, <Dictator> Meindert A. Brunetta Genera, MD Cherre Huger MD ELECTRONICALLY SIGNED 03/02/2011 20:58

## 2014-05-18 NOTE — Consult Note (Signed)
ERCP showed no ampullary mass. CBD dilated. No obvious stones or stricture. Excellent bile drainage after biliary sphincterotomy. Ampullary stenosis?. Pancreatic duct normal. No obvious signs of ampullary or pancreatic mass. Cytology brushings done due to elevated CA 19-9 level. Keep patient on clears rest of today. If pancreatic levels are ok, then patient can be discharged to home on low fat diet with f/u with me in office in 1-2 wks. Thanks.  Electronic Signatures: Verdie Shire (MD)  (Signed on 11-Feb-13 15:53)  Authored  Last Updated: 11-Feb-13 15:53 by Verdie Shire (MD)

## 2014-05-18 NOTE — Consult Note (Signed)
Chief Complaint:   Subjective/Chief Complaint Pain better. Lipase down but still high. No nausea now.   VITAL SIGNS/ANCILLARY NOTES: **Vital Signs.:   08-Feb-13 04:29   Vital Signs Type Routine   Temperature Temperature (F) 98.4   Celsius 36.8   Temperature Source oral   Pulse Pulse 67   Pulse source per Dinamap   Respirations Respirations 18   Systolic BP Systolic BP 470   Diastolic BP (mmHg) Diastolic BP (mmHg) 57   Mean BP 78   BP Source Dinamap   Pulse Ox % Pulse Ox % 96   Pulse Ox Activity Level  At rest   Oxygen Delivery Room Air/ 21 %   Brief Assessment:   Cardiac Regular    Respiratory clear BS    Gastrointestinal epig tenderness   Routine Hem:  08-Feb-13 04:15    WBC (CBC) 5.4   RBC (CBC) 3.89   Hemoglobin (CBC) 10.1   Hematocrit (CBC) 31.3   Platelet Count (CBC) 270   MCV 80   MCH 26.0   MCHC 32.4   RDW 16.7  Routine Chem:  08-Feb-13 04:15    Glucose, Serum 75   BUN 16   Creatinine (comp) 1.29   Sodium, Serum 144   Potassium, Serum 4.1   Chloride, Serum 106   CO2, Serum 27   Calcium (Total), Serum 9.5  Hepatic:  08-Feb-13 04:15    Bilirubin, Total 1.8   Alkaline Phosphatase 189   SGPT (ALT) 468   SGOT (AST) 435   Total Protein, Serum 6.6   Albumin, Serum 3.1  Routine Chem:  08-Feb-13 04:15    Osmolality (calc) 287   eGFR (African American) 54   eGFR (Non-African American) 45   Anion Gap 11   Lipase 2984  Routine Hem:  08-Feb-13 04:15    Neutrophil % 65.8   Lymphocyte % 17.2   Monocyte % 10.2   Eosinophil % 6.2   Basophil % 0.6   Neutrophil # 3.6   Lymphocyte # 0.9   Monocyte # 0.6   Eosinophil # 0.3   Basophil # 0.0  Hepatic:  08-Feb-13 04:15    Bilirubin, Direct 1.10  Routine Chem:  08-Feb-13 04:15    Cholesterol, Serum 127   Triglycerides, Serum 91   HDL (INHOUSE) 30   VLDL Cholesterol Calculated 18   LDL Cholesterol Calculated 79   Radiology Results: MRI:    07-Feb-13 15:26, MRCP MR Cholangiogram   MRCP MR  Cholangiogram    REASON FOR EXAM:    Recurrent pancreatitis, transaminitis, pt does not   have metal in ears, had ct he  COMMENTS:       PROCEDURE: MR  - MR CHOLANGIOGRAM  - Mar 03 2011  3:26PM     RESULT: Comparison: CT of the abdomen and pelvis 02/16/2011    Technique: Standard M.R.C.P. protocol, without intravenous contrast.    Findings:  The extrahepatic bile duct is dilated to at or just proximal to the level   of the ampulla. It measures 14 mm in diameter. No intraluminal filling   defect identified. The cystic duct remnant is dilated. The intrahepatic   biliary ducts are dilated. The main pancreatic duct is dilated, measuring     3-4 mm. No definite pancreatic head mass seen on this noncontrast study.   The patient is status post cholecystectomy.     There are no findings on these T2 weighted images to correlate with the   large area of ill-defined abnormality in  the right hepatic lobe seen on   the recent prior abdominal CT. However, evaluation is limited by lack of   intravenous contrast as the study was tailored for only M.R.C.P. There   are a few tiny subcentimeter foci of T2 hyperintensity in the inferior   right hepatic lobe which are too small to characterize. Ill-defined fluid   collections are seen adjacent to the tail of the pancreas and the spleen,   which are similar to prior given differences in technique.    The left kidney is absent. T2 hyperintense structure in the right kidney   is incompletely characterized, but likely represents a cyst.    IMPRESSION:   1. The intrahepaticand extrahepatic bile ducts, as well as the   pancreatic duct, are dilated and seen to the level at or just proximal to   the ampulla. No intraluminal filling defect identified. If there is   continued clinical concern regarding a cause of obstruction in the region   of the ampulla, further evaluation could be provided with ERCP.  2. No findings seen to correlate with the large area of  abnormality in   the right hepatic lobe on the recent prior CT. Evaluation is limited by   lack of intravenous contrast. Further evaluation could be provided with   contrast-enhanced abdominal MRI or potentially followup contrast-enhanced   CT.  3. Ill-defined fluid collections adjacent to the pancreatic tail are   likely similar to the prior CT given differences in technique. These are   nonspecific and possibly sequela pancreatitis.      Verified By: Gregor Hams, M.D., MD   Assessment/Plan:  Assessment/Plan:   Assessment Recurrent pancreatitis. Results of MRCP discussed. No CBD stone but CBD and PD dilated.    Plan Discussed ERCP with patient to evaluate CBD. Discussed potential risks, incl worsening of pancreatitis. Would prefer pancreatitis to improve before scheduling ERCP. Dr. Gustavo Lah will see patient over the weekend. If better, consider ERCP on Monday afternoon. Thanks   Electronic Signatures: Verdie Shire (MD)  (Signed 08-Feb-13 12:11)  Authored: Chief Complaint, VITAL SIGNS/ANCILLARY NOTES, Brief Assessment, Lab Results, Radiology Results, Assessment/Plan   Last Updated: 08-Feb-13 12:11 by Verdie Shire (MD)

## 2014-05-18 NOTE — H&P (Signed)
PATIENT NAME:  Crystal Bailey, Crystal Bailey MR#:  [id number] DATE OF BIRTH:  12-05-1950  DATE OF ADMISSION:  02/11/2011  PRIMARY CARE PHYSICIAN: Dr. Brunetta Genera    REFERRING PHYSICIAN: Dr. Renard Hamper   CHIEF COMPLAINT: Epigastric pain.   HISTORY OF PRESENT ILLNESS: The patient is a 64 year old female with past medical history of hypertension, chronic arthritis, chronic obstructive pulmonary disease, and Vitamin D deficiency who was in her usual state of health until this morning when she developed sudden onset of severe epigastric pain associated with nausea and vomiting. The patient denies any radiation of abdominal pain. Denies any diarrhea or constipation. She was found to have pancreatitis with lipase level more than 3000. The patient has had prior cholecystectomy.   ALLERGIES: No known drug allergies.   PAST MEDICAL HISTORY:  1. Hypertension. 2. Osteoarthritis. 3. COPD/asthma. 4. Iron deficiency. 5. Vitamin D deficiency.   MEDICATIONS:  1. Vitamin D 1000 international units daily. 2. Iron 28 mg daily.  3. Lisinopril 5 mg daily.  4. Flexeril 10 mg t.i.d. p.r.n.  5. Advair 250/50 1 puff b.i.d.  6. Meloxicam 7.5 mg daily.  7. VESIcare 5 mg daily.  8. HCTZ 25 mg daily.    PAST SURGICAL HISTORY:  1. Cholecystectomy. 2. Left nephrectomy for renal cell cancer. Colonoscopy in the past which had been normal.  3. Bilateral carpal tunnel release.  4. Partial hysterectomy.  5. Right foot surgery for bunion.   SOCIAL HISTORY: The patient smokes very occasionally, maybe one cigarette in 2 to 3 days. Denies any alcohol or drug abuse. She lives with her daughter.   FAMILY HISTORY: Mother had breast cancer and diabetes. The patient does not remember her father.  REVIEW OF SYSTEMS: CONSTITUTIONAL: The patient denies any fever, unusual weight changes. Reports weakness and fatigue. EYES: Denies any blurred or double vision. Wears glasses. ENT: Denies any tinnitus, ear pain. RESPIRATORY: Denies any painful  respiration, wheezing, or cough. CARDIOVASCULAR: Denies any chest pain, palpitations. Has history of hypertension. GI: Reports nausea, vomiting, and abdominal pain. Denies any diarrhea or constipation. GU: Denies any nocturia or pyuria. MUSCULOSKELETAL: Has osteoarthritis. Denies any swelling or gout. INTEGUMENTARY: Denies any rashes or eruptions. NEUROLOGICAL: Denies any fainting spells, blackouts. PSYCHIATRIC: Denies any insomnia or depression. ENDOCRINE: Denies any thyroid problems, increased sweating. HEME/LYMPH: Denies any anemia or easy bruisability.   PHYSICAL EXAMINATION:   VITAL SIGNS: Temperature 95.4, heart rate 62, respiratory rate 20, blood pressure 103/57, pulse oximetry 94% on room air.   GENERAL: The patient is a 64 year old African American female who is sitting propped up in bed in some distress.   HEAD: Atraumatic, normocephalic.   EYES: Mild pallor. No icterus or cyanosis. Pupils equal, round, and reactive to light and accommodation. Extraocular movements intact.   ENT: Dry mucous membranes. No oropharyngeal erythema or thrush.   NECK: Supple. No masses. No JVD. No thyromegaly or lymphadenopathy.   CHEST WALL: No tenderness to palpation. Not using accessory muscles for respiration. No intercostal muscle retractions.  LUNGS: Bilaterally clear to auscultation. No wheezing, rales, or rhonchi.   CARDIOVASCULAR: S1, S2 regular. No murmur, rubs, or gallops.   ABDOMEN: Soft. There is no guarding. No rigidity. No distention. The patient is severely tender to palpation in her epigastric region. Hyperactive bowel sounds.   SKIN: No rashes or lesions.   PERIPHERIES: No pedal edema.. 2+ pedal pulses.   MUSCULOSKELETAL: No cyanosis or clubbing.   NEUROLOGIC: Awake, alert, oriented x3. Nonfocal neurological exam. Cranial nerves grossly intact.   PSYCH: Normal  mood and affect.   LABORATORY, DIAGNOSTIC, AND RADIOLOGICAL DATA: V/Q scan very low probability for PE.   Abdominal  ultrasound shows pancreatitis, mild intra and extrahepatic biliary ductal dilatation which could be secondary to post cholecystectomy state but distal obstruction cannot be excluded. Mild complicated right renal cyst. Prior left nephrectomy.   Chest x-ray shows no acute cardiopulmonary disease.   CK 466, Troponin negative. White count 8.8, hemoglobin 13.5, hematocrit 43.2, platelets 251, glucose 191, BUN 19, creatinine 1.81, sodium 136, potassium 3.6, chloride 97, bilirubin 1.4, alkaline phosphatase 73, ALT 256, AST 409. D-dimer 5.77. Lipase more than 3000. Magnesium 1.5. BNP 15.   ASSESSMENT AND PLAN: This is a 64 year old female with history of chronic obstructive pulmonary disease, asthma, and hypertension who presents with epigastric pain.  1. Pancreatitis. The patient's lipase is more than 3000. The etiology of the patient's pancreatitis is unclear at present. She is status post cholecystectomy and does not drink any alcohol. She is on lisinopril and HCTZ which can potentially cause pancreatitis. Will keep the patient n.p.o. except for ice and medications. Start her on p.r.n. antiemetics and analgesics. Will check a fasting lipid profile. Repeat lipase level in a.m.  2. Epigastric pain. This could be due to acute pancreatitis, however, the patient also reports almost daily NSAID use and is possibility of peptic ulcer disease/gastritis given the patient's severe tenderness in her epigastric region. Will obtain a GI consultation. Start on PPI b.i.d. 3. Elevated liver function tests. The patient's bilirubin is mildly elevated at 1.4 and ALT and AST are also elevated although her ALT is normal. Her abdominal ultrasound is normal other than evidence of pancreatitis. Liver texture is normal. Will check a hepatic panel. Will recheck LFTs in a.m. If they are not getting any better, should consider MRCP given the presence of mild intrahepatic and extrahepatic ductal dilatation.  4. Hyperglycemia, possibly  reactive. The patient has no history of diabetes. Will check a hemoglobin A1c.  5. Acute renal failure likely due to dehydration since the patient has been having nausea and vomiting since this morning. Will avoid nephrotoxic medications. Monitor strict I's and O's and hydrate with IV fluids. Will recheck a level in a.m.  6. Elevated D-dimer. This could be possibly due to acute illness. V/Q scan is low probability for any PE.  7. Hypomagnesemia. Will replace IV.  8. Hypertension. The patient's blood pressure is overall controlled at present. Will hold her lisinopril and HCTZ in view of acute pancreatitis. Will place on p.r.n. hydralazine.  9. Chronic obstructive pulmonary disease/asthma. Will continue Advair and place on p.r.n. DuoNebs.  10. Will place on GI and DVT prophylaxis.  Reviewed old medical records, discussed with the ED physician, discussed with the patient and her daughter the plan of care and management.   TIME SPENT: 75 minutes.   ____________________________ Cherre Huger, MD sp:drc D: 02/11/2011 16:20:59 ET T: 02/11/2011 16:39:42 ET JOB#: [id number]  cc: Cherre Huger, MD, <Dictator> Meindert A. Brunetta Genera, MD Cherre Huger MD ELECTRONICALLY SIGNED 02/11/2011 21:34

## 2014-05-18 NOTE — Consult Note (Signed)
PATIENT NAME:  Crystal Bailey, Crystal Bailey MR#:  [id number] DATE OF BIRTH:  05/11/1950  DATE OF CONSULTATION:  02/12/2011  REFERRING PHYSICIAN:   CONSULTING PHYSICIAN:  Manya Silvas, MD  HISTORY OF PRESENT ILLNESS: Patient is a 64 year old black female who had the sudden onset of epigastric abdominal pain associated with nausea and vomiting. She was brought to the ER and was found to have a lipase greater than 3000 and was admitted for pancreatitis. I was asked to see her in consultation for this problem.   ALLERGIES: No known drug allergies.   CURRENT MEDICATIONS:  1. Lisinopril 5 mg a day.  2. Flexeril 10 mg t.i.d. p.r.n.  3. Advair 250/50, 1 puff b.i.d.  4. Meloxicam 7.5 mg daily. 5. VESIcare 5 mg a day.  6. HCTZ 25 mg a day.   PAST SURGICAL HISTORY:  1. Cholecystectomy.  2. Left nephrectomy for renal cancer. 3. Colonoscopy with Dr. Nicolasa Ducking that was negative.  4. Bilateral carpal tunnel syndrome release.  5. Partial hysterectomy.  6. Foot surgery.   SOCIAL HISTORY: Used to smoke a 1/3 to 1/2 pack a day up until year ago then cut down to 1 to 3 cigarettes a day. No alcohol history. 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 Has not worked for several years. She has not drunk any alcohol since May 08, 1972 when mother died.   FAMILY HISTORY: Mother and a sister with breast cancer.   REVIEW OF SYSTEMS: No fever or chills. She does have epigastric and low chest pain which is her pancreatitis pain. Pain does not radiate into her back. Nausea and vomiting which don't necessarily relieve the pain. No melena. No hematemesis. No hematochezia. No dysuria or hematuria. No skin rashes. She does say a fluid pill was started last week. This is probably the HCTZ.   PAST MEDICAL HISTORY: She does have a history of asthma, uses an inhaler for it. She denies any history of heart disease. No strokes and no blood clots.   PHYSICAL EXAMINATION:  GENERAL: Black female looks older than stated age.    HEENT: Sclerae nonicteric. Conjunctivae negative. Tongue negative. The head is atraumatic.   CHEST: Clear. No crackles at the bases.   HEART: 1/6 systolic murmur.   ABDOMEN: Bowel sounds are diminished. There is epigastric tenderness to palpation. No hepatosplenomegaly.   EXTREMITIES: No edema.   SKIN: Warm and dry.   PSYCH: Mood and affect are appropriate. Fair historian.   VITAL SIGNS: Blood pressure 154/76, pulse 102, pulse oximetry 98% on room air, respirations 18, temperature 98.7.  LABORATORY, DIAGNOSTIC AND RADIOLOGICAL DATA: Glucose 130, BUN 22, creatinine 1.27, sodium 137, potassium 4.6, chloride 102, CO2 24, calcium 8.8, magnesium 1.5, cholesterol 159, triglycerides 90, lipase greater than 3000, hemoglobin A1c 6.6, total protein 7.1, albumin 3.7, total bilirubin 1, alkaline phosphatase 77. SGOT 1308, SGPT 958, both are up significantly from admission. CPK 466. CPK, MB and troponins negative. White count 7.8, hemoglobin 12.9, which is down from 13.5 yesterday, platelet count 211. D-dimer 5.77. Ultrasound of the abdomen: Pancreatic head is slightly hypoechoic as can be seen with pancreatitis. There is a mildly complicated cyst on the right kidney. Mild intra and extrahepatic duct dilatation may be secondary to gallbladder removal. Cannot rule out stone, obstruction. Because of elevated d-dimer she had a lung scan low probability for pulmonary embolus. Chest x-ray shows no acute disease.   ASSESSMENT: Pancreatitis. Most common cause for this would be gallstone. Patient with no significant  alcohol history since 1974 if she is an accurate historian. It was noted in the history that she started HCTZ a week ago for hypertension and it is very possible that the pancreatitis could be related to HCTZ although this is a rare cause of pancreatitis; in this case the historical data fits.   I am concerned about the continued rise in the LFTs after hospitalization.   RECOMMENDATIONS: M.R.C.P. to  check for common bile duct stone. If present may be an indication for more urgent ERCP and sphincterotomy since liver functions are climbing rapidly. Her pain medicine is small infrequent doses and needs to be increased, I will go ahead and order this. She has been ordered pantoprazole and Zofran. Will order compression devices for her legs decrease the chance of deep venous thromboses while lying in bed from her pancreatitis. I will follow with you.  ____________________________ Manya Silvas, MD rte:cms D: 02/12/2011 08:23:20 ET T: 02/12/2011 08:44:26 ET JOB#: [id number]  cc: Manya Silvas, MD, <Dictator> Manya Silvas MD ELECTRONICALLY SIGNED 03/10/2011 17:09

## 2014-05-18 NOTE — Consult Note (Signed)
Brief Consult Note: Diagnosis: Abdominal pain epigastric.  Recent admission for acute pancreatitis with suspected CBD stone.  Readmission for acute pancreatitis with same concern of underlying etiological cause.  Noted elevation of total bilirubin, AST and ALT.   Discussed with Attending MD.   Comments: Patient's presentation discussed with Dr. Verdie Shire.  Recommendation is to proceed with MRCP already ordered for today.  Pending results.  Depending on results patient may warrant ERCP.  NPO status remain in place at time.  IV hydration and pain management.  Continue to monitor laboratory study results.  Electronic Signatures: Payton Emerald (NP)  (Signed 07-Feb-13 15:22)  Authored: Brief Consult Note   Last Updated: 07-Feb-13 15:22 by Payton Emerald (NP)

## 2014-05-18 NOTE — Consult Note (Signed)
Temp down, no signif pain on palpation of abd.  CXR with LL  pneumonia, antibiotics switched to Levaquin, exam shows decreased breath sounds in left base.  No egophony.  Albumin 2.2.  Lipase down some today.  Hemoglobin falling over last few days significantly.  Need hemocults and anemia work up.  No obvious passing of blood.  May need transfusion due to pneumonia.  Discussed feeding tube with her if unable to tolerated refeeding.  would give another trial at feeding and would try low fat low cholesterol diet tomorrow given her absense of pain and tenderness.  Atelectasis with pancreatitis can be a set  up for pneumonia.  Anemia given no obvious blood loss.  Consider transfusion and anemia labs first,.  Electronic Signatures: Manya Silvas (MD)  (Signed on 25-Jan-13 07:59)  Authored  Last Updated: 25-Jan-13 07:59 by Manya Silvas (MD)

## 2014-05-18 NOTE — Consult Note (Signed)
Patient is a poor historian.  Tolerating regular diet at lunch well without nausea.  States she is hoping for discharge in the am.  Patient reports history of left renal cancer in 2011 with nephrectomy at [facility].  CA19.9 elevated at 96.  With patients diagnosis of Pancreatitis elevation may be due to this.   She denies any weight loss or decreasing appetite.  Spoke with patient and her daughter Manuela Schwartz who agreed with seeing the patient in clinic and following as an outpatient.  Will have the patient see Dr, Inez Pilgrim in clinic in approx 2 weeks after d/c for further work-up.    Electronic Signatures: Waynard Edwards (NP)  (Signed on 26-Jan-13 15:53)  Authored  Last Updated: 26-Jan-13 15:53 by Waynard Edwards (NP)

## 2014-05-18 NOTE — Consult Note (Signed)
Brief Consult Note: Diagnosis: ? Choledocholithiasis.   Patient was seen by consultant.   Orders entered.   Discussed with Attending MD.   Comments: Patient with acute pancreatitis in the abscence of ETOH and fluctuating LFT's and dilated ducts post cholecystectomy. Patient can not have MRCP.  Low grade fever without RUQ tenderness.  Impression: Probable choledocholithiasis. LFT's are better today but bilirubin went up yesterday without any obvious cause, raising concern about possible floating CBD stone.   Recommendations: NPO after mid night. Repeat LFT's in am. Patient will likely require ERCP. The procedure have been discussed with her in detail along with its complications, including but not limited to bleeding, infection, perforation, pancreatitis and respiratory failure. The risk of post ERCP pancreatitis and possible consequences were discussed in detail as well as risk of respiratory depression and possible need for ventilator. She is in full agreement. Will re-evaluate in am. Thanks.  Electronic Signatures: Jill Side (MD)  (Signed 22-Jan-13 18:16)  Authored: Brief Consult Note   Last Updated: 22-Jan-13 18:16 by Jill Side (MD)

## 2014-05-18 NOTE — Consult Note (Signed)
PATIENT NAME:  Crystal Bailey, Crystal Bailey MR#:  [id number] DATE OF BIRTH:  1950-05-24  DATE OF CONSULTATION:  03/03/2011  REFERRING PHYSICIAN:  S. Panwar, MD   CONSULTING PHYSICIAN:  Verdie Shire, MD/Feleica Fulmore Ruthe Mannan, NP, [facility]  PRIMARY CARE PHYSICIAN:  Dr. Brunetta Genera    REASON FOR CONSULTATION: Pancreatitis with elevated LFTs.   HISTORY OF PRESENT ILLNESS: Crystal Bailey is a 64 year old African American female who has a past medical history significant for chronic obstructive pulmonary disease, osteoarthritis, hypertension, iron deficiency, and vitamin D deficiency. She recently had a prolonged hospitalization on 01/18 to 02/20/2011 for acute pancreatitis as well elevation in transaminase levels. She was discharged home with a lipase of 104. She was completely asymptomatic at the time of discharge and was tolerating a low fat diet.  She presented back to the Emergency Room on 03/02/2011 for sudden onset of epigastric pain which started at 7:00 a.m. that morning. She had not eaten. No nausea. No vomiting. She had been experiencing constipation. At the time of her last hospitalization there was concern for possibly retained common bile duct stone. She is status post cholecystectomy. Due to the fact that she had had ear surgery done 20 years ago there was concern that possibly she had metal present from that surgery was unable to proceed with an MRCP. That has since been clarified after them reviewing a CT scan of her head done in 2009 and there was no evidence of metal particle being present. Currently she is not experiencing any abdominal pain. No nausea. No vomiting, just occasionally maybe spitting up phlegm, she says. She has  remained on n.p.o. diet. She is pending having MRCP done today.   During her last hospitalization her CEA and AFP were normal, very mildly elevated CA19-9. She was seen by oncology as she is followed by Dr. Inez Pilgrim as she has significant medical history of renal cell carcinoma  and status post left nephrectomy. No rectal bleeding. No melena.   PAST MEDICAL HISTORY: Recent hospitalization for pancreatitis, hypertension, osteoarthritis, chronic obstructive pulmonary disease, asthma, iron deficiency anemia, vitamin D deficiency, renal cell cancer.  MEDICATIONS: 1. Norvasc 10 mg a day.  2. Omeprazole 20 mg daily.  3. Advair 250/50 1 puff b.i.d.  4. Vitamin D 1000 international units daily.  5. Iron 28 mg daily.  6. Vesicare 5 mg daily.  7. Paxil 10 mg 3 times a day as needed. 8. Meloxicam 7.5 mg b.i.d. 9. Tylenol as directed as needed. 10. Cascade apply to affected areas as needed.   ALLERGIES: None.   PAST SURGICAL HISTORY:  Cholecystectomy, left nephrectomy for renal cell cancer, colonoscopy five years ago, carpal tunnel release, partial hysterectomy, right foot surgery/ bunionectomy.   FAMILY HISTORY: Mother with history of breast cancer and diabetes. Father unknown history.   SOCIAL HISTORY: Remote tobacco use. No history of heavy alcohol use. No recreational drug use. Resides with her daughter.   REVIEW OF SYSTEMS: CONSTITUTIONAL: Denies any fevers, weakness, or fatigue. EYES: No blurred vision or double vision. ENT: No tinnitus or ear pain. RESPIRATORY: Denies any shortness of breath, coughing, or wheezing. CARDIOVASCULAR: No chest pain or heart palpitations. Known history of hypertension. GI: See history of present illness. GU: No nocturia or polyuria. MUSCULOSKELETAL: Significant history of osteoarthritis. No history of gout. INTEGUMENT: No rashes. No lesions. NEUROLOGICAL: No history of CVA, seizure disorder, or transient ischemic attack. PSYCH: No depression. No anxiety. ENDOCRINE: No heat or cold intolerance. HEME/LYMPH: Denies any anemia or easy bruising  or bleeding.   PHYSICAL EXAMINATION:  VITAL SIGNS: Temperature 98.9, pulse 69, respirations are 20, blood pressure 117/65, pulse oximetry 98%.   HEENT: Normocephalic, atraumatic. Pupils equal, reactive  to light. Conjunctivae clear. Sclerae anicteric.   NECK: Supple. Trachea midline. No lymphadenopathy or thyromegaly.   PULMONARY: Symmetric rise and fall of chest. Clear to auscultation throughout. No adventitious sounds.   ABDOMEN: Soft, nondistended. Bowel sounds in all four quadrants. Nontender. No bruits. No masses. No evidence of hepatosplenomegaly. No grimacing or discomfort noted with change of position to abdomen causing pain.   RECTAL: Deferred.   MUSCULOSKELETAL: Moving all four extremities. No contractures. No clubbing.   NEUROLOGICAL: No gross neurological deficits.   PSYCH: Alert and oriented times four. Memory grossly intact. Flat affect. Depressive mood.   EXTREMITIES: No edema.   LABORATORY, DIAGNOSTIC, AND RADIOLOGICAL DATA: Chemistry panel on admission: Glucose 134, creatinine was 1.32. EGFR African American low at 38, otherwise within normal limits. Calcium was 10, lipase greater than 3000. Today lipase was rechecked, greater than 3000. Magnesium level was 1.7. Hepatic panel on admission: Total bilirubin 1.2, AST 507 with an ALT of 248. Cardiac enzymes, first and second in series, within normal limits. Third troponin less than 0.02, CK-MB less than 0.5, and CK total 106.   CBC on admission: WBC count was elevated at 13.2, hemoglobin 11.9, MCV low at 25.9, MCHC low at 31.9, and RDW elevated at 16. After IV hydration hemoglobin dropped to 10.6 with hematocrit 32.7 from 37.4. MCV remained at 25.9 and RDW elevated at 16.4. Differential lymphocyte number low at 0.7. PT is 13.8 with an INR 1 and PTT 24.9. Urinalysis: +1 blood, RBCs 4 per high-power field, WBC 1 per high-power field. Mucous was present.   Chest PA and lateral: No significant abnormality noted.   IMPRESSION: Abdominal pain, epigastric, recent admission for acute pancreatitis January 2013 at that time suspected common bile duct stone. Readmission currently is for acute pancreatitis with the same suspicion of  underlying etiological cause being common bile duct stone as lipid panel previously which was done fasting did not reveal elevation in triglycerides and no history of alcohol use.   PLAN: The patient's presentation was discussed with Dr. Verdie Shire. Recommendation is to proceed with MRCP, already ordered today.  Pending results the patient may warrant ERCP. N.p.o. status to remain in place at this time. IV hydration and pain management as ordered. Continue to monitor laboratory study results.  These services provided by Ebony Cargo, NP in collaborative agreement with Dr. Verdie Shire.  Thank you for allowing [place] to participate in the care of Crystal Bailey during her hospitalization.   ____________________________ Payton Emerald, NP dsh:bjt D: 03/03/2011 15:22:32 ET T: 03/03/2011 17:10:18 ET JOB#: [id number]  cc: Payton Emerald, NP, <Dictator> Payton Emerald MD ELECTRONICALLY SIGNED 03/03/2011 18:10

## 2014-05-18 NOTE — Consult Note (Signed)
Pt doing some better but still with pain, Doppler of legs neg for DVT.  Lipase down to 1750, WBC 8.2, Hgb 11.5, plt 195, SGOT down to 500, SGPT to 670, TB 2, DB 1.2.  Chest clear, heart RRR, abd still tender in epigastric area. Will repeat US tomorrow and LFT's and lipase, talk to ERCP doctor about her.  Encouraged her to ask for meds.  Electronic Signatures: Manya Silvas (MD)  (Signed on 20-Jan-13 09:44)  Authored  Last Updated: 20-Jan-13 09:44 by Manya Silvas (MD)

## 2014-05-18 NOTE — Consult Note (Signed)
Pt has elevated D dimer,  could see this prolonged bed rest.  BNP 4270, Abd soft, mild distended, bowel sounds better now no palpable mass or loop of bowel..  Pt pulse 168, bp 90/50, family wants comfort measures so will stop bowel cleanse.  I will not plan on any GI procedures.   Electronic Signatures: Manya Silvas (MD) (Signed on 20-Jan-13 09:52)  Authored   Last Updated: 20-Jan-13 12:25 by Manya Silvas (MD)

## 2014-05-18 NOTE — Consult Note (Signed)
Chief Complaint:   Subjective/Chief Complaint Feels better. No abdominal pain or vomiting.   VITAL SIGNS/ANCILLARY NOTES: **Vital Signs.:   23-Jan-13 05:26   Vital Signs Type Routine   Temperature Temperature (F) 99   Celsius 37.2   Temperature Source oral   Pulse Pulse 75   Pulse source per Dinamap   Respirations Respirations 18   Systolic BP Systolic BP 229   Diastolic BP (mmHg) Diastolic BP (mmHg) 69   Mean BP 91   BP Source Dinamap   Pulse Ox % Pulse Ox % 96   Pulse Ox Activity Level  At rest   Oxygen Delivery Room Air/ 21 %   Brief Assessment:   Additional Physical Exam Abdomen is soft and benign. Monimal upper abdominal tenderness.   Routine Hem:  23-Jan-13 04:25    WBC (CBC) 11.5   RBC (CBC) 3.19   Hemoglobin (CBC) 8.1   Hematocrit (CBC) 24.8   Platelet Count (CBC) 197   MCV 78   MCH 25.3   MCHC 32.6   RDW 13.7  Hepatic:  23-Jan-13 04:25    Bilirubin, Total 0.8   Alkaline Phosphatase 98   SGPT (ALT) 167   SGOT (AST) 55   Total Protein, Serum 5.7   Albumin, Serum 2.2  Routine Chem:  23-Jan-13 04:25    Lipase 877  Routine Hem:  23-Jan-13 04:25    Neutrophil % 79.8   Lymphocyte % 8.9   Monocyte % 9.8   Eosinophil % 1.4   Basophil % 0.1   Neutrophil # 9.2   Lymphocyte # 1.0   Monocyte # 1.1   Eosinophil # 0.2   Basophil # 0.0  Hepatic:  23-Jan-13 04:25    Bilirubin, Direct 0.4   Assessment/Plan:  Assessment/Plan:   Assessment Acute pancreatitis. lipase is higher but she is clinically much better. Low grade fever with mild leucocytosis. LFT's are trending down with bilirubin being normal. Choledocholithiasis is still a possibility.    Plan I have recommended a CT due to low grade fever, increased lipase and mild leucocytosis. I will hold off on ERCP due to improved pain and LFT's as well as the fact that ERCP is not without risks due to her obesity and recent pancreatitis. If Ct shows a CBD stone or if there is worsning in her clinical  condition or LFT's, an ERCP will be done otherwise will continue to treat conservatively. Dr. Vira Agar will continue to follow her and either I or Dr. Candace Cruise will be involved if an ERCP is needed. Thanks.   Electronic Signatures: Jill Side (MD)  (Signed 23-Jan-13 12:11)  Authored: Chief Complaint, VITAL SIGNS/ANCILLARY NOTES, Brief Assessment, Lab Results, Assessment/Plan   Last Updated: 23-Jan-13 12:11 by Jill Side (MD)

## 2014-05-18 NOTE — Consult Note (Signed)
Chief Complaint:   Subjective/Chief Complaint Pt tolerated low fat diet without problems. No abd pain or nausea, though lipase higher today. Wants to go home.   VITAL SIGNS/ANCILLARY NOTES: **Vital Signs.:   27-Jan-13 05:16   Vital Signs Type Routine   Temperature Temperature (F) 98.3   Celsius 36.8   Temperature Source oral   Pulse Pulse 66   Pulse source per Dinamap   Respirations Respirations 18   Systolic BP Systolic BP 161   Diastolic BP (mmHg) Diastolic BP (mmHg) 68   Mean BP 93   BP Source Dinamap   Pulse Ox % Pulse Ox % 95   Pulse Ox Activity Level  At rest   Oxygen Delivery Room Air/ 21 %   Brief Assessment:   Cardiac Regular    Respiratory crackles    Gastrointestinal Normal   Routine Hem:  27-Jan-13 04:26    WBC (CBC) 11.4   RBC (CBC) 3.62   Hemoglobin (CBC) 9.4   Hematocrit (CBC) 29.1   Platelet Count (CBC) 291   MCV 81   MCH 26.0   MCHC 32.2   RDW 15.4  Routine Chem:  27-Jan-13 04:26    Glucose, Serum 90   BUN 10   Creatinine (comp) 0.95   Sodium, Serum 141   Potassium, Serum 3.3   Chloride, Serum 103   CO2, Serum 28   Calcium (Total), Serum 8.7  Hepatic:  27-Jan-13 04:26    Bilirubin, Total 0.5   Alkaline Phosphatase 65   SGPT (ALT) 77   SGOT (AST) 46   Total Protein, Serum 5.9   Albumin, Serum 2.2  Routine Chem:  27-Jan-13 04:26    Osmolality (calc) 280   eGFR (African American) >60   eGFR (Non-African American) >60   Anion Gap 10   Lipase 1046  Routine Hem:  27-Jan-13 04:26    Neutrophil % 81.5   Lymphocyte % 10.2   Monocyte % 6.8   Eosinophil % 1.4   Basophil % 0.1   Neutrophil # 9.3   Lymphocyte # 1.2   Monocyte # 0.8   Eosinophil # 0.2   Basophil # 0.0   Assessment/Plan:  Assessment/Plan:   Assessment Pancreatitis. Clinically resolving even though lipase higher on solid diet.    Plan If patient wants to home, then ok for discharge today from GI point of view. Make sure she stays on low fat, low chol diet. Continue  oral Abx. Make sure she f/u with Dr. Vira Agar so that we can moniter lipase. Thanks.   Electronic Signatures: Verdie Shire (MD)  (Signed 27-Jan-13 08:21)  Authored: Chief Complaint, VITAL SIGNS/ANCILLARY NOTES, Brief Assessment, Lab Results, Assessment/Plan   Last Updated: 27-Jan-13 08:21 by Verdie Shire (MD)

## 2014-05-18 NOTE — Discharge Summary (Signed)
PATIENT NAME:  Crystal Bailey, Crystal Bailey MR#:  [id number] DATE OF BIRTH:  1950-03-25  DATE OF ADMISSION:  03/02/2011 DATE OF DISCHARGE:  03/09/2011  DISCHARGE DIAGNOSES:  1. 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 Status post biliary sphincterotomy.  2. Hypertension.  3. Chronic obstructive pulmonary disease.  4. Iron deficiency anemia.  5. Osteoarthritis.  6. Vitamin D deficiency.   DISCHARGE MEDICATIONS:  1. Advair Diskus 250/50, 1 puff b.i.d.  2. Vitamin D 1000 units, 1 capsule daily. 3. Iron 28 mg p.o. daily. 4. Vesicare 5 mg p.o. daily. 5. Meloxicam 7.5 mg p.o. daily.  6. Norvasc 10 mg p.o. daily. 7. Omeprazole 20 mg p.o. daily. 8. Flexeril 10 mg p.o. t.i.d. as needed.   DIET: Low sodium diet.   FOLLOWUP: Follow up with Dr. Candace Cruise in 1 to 2 weeks and also Dr. Brunetta Genera in 1 to 2 weeks.   CONSULTATIONS:  GI consultation with Dr. Candace Cruise.   PROCEDURES: The patient had an ERCP done 02/11 which showed no ampullary mass.  She had excellent biliary drainage after biliary sphincterotomy. Cytology brushings were done because of elevated CA19-9.  The patient needs to follow up with Dr. Candace Cruise regarding the brushing results.  LABORATORY, DIAGNOSTIC, AND RADIOLOGICAL DATA: Lipase on admission 3000, AST on admission 507, ALT 248, alkaline phosphatase 117. WBC 13.2, hemoglobin 11.9, hematocrit 37.4, platelets 378. Chest x-ray showed no significant abnormality. Urinalysis hazy with no bacteria. MRCP showed intrahepatic and extrahepatic biliary duct dilatation along with pancreatic duct. The patient had no intraluminal filling defects, no findings correlating with large area of abnormality in the right hepatic lobe on r ecent CT.  The patient had an ill-defined fluid collection adjacent to the pancreatic tail seen on , prior CT. The patient also had evidence of obstruction near the ampulla.   HOSPITAL COURSE:  64 year old female with history of recent hospitalization in 01/2011 for pancreatitis. She  went home. The patient was readmitted on 02/06 for abdominal pain,  nausea, and vomiting with lipase elevation up to 3000 with elevated LFTs. She was admitted to the hospitalist service for recurrent pancreatitis, made n.p.o., and started on IV fluids. GI consultation was obtained with Dr. Candace Cruise. MRCP was done which showed dilation of intra- and extrahepatic bile ducts along possible ampullary mass.  The patient's lipase gradually improved along with improvement in  LFTs. Lipase on 02/11 was 675. Also her LFTs showed AST of 317 and ALT of 237, alkaline phosphatase 224 on 02/09. The  patient was taken to ERCP as mentioned by Dr. Candace Cruise on 02/11 which showed ampullary stenosis. She did not have any mass. Because CA19-9 was elevated she had cytology brushings done with biliary sphincterotomy. The patient  had lipase elevation on 02/12 up to 1800 after ERCP. Even though the patient wanted to go home yesterday, we kept her and watched her overnight and continued only clear liquids. Lipase is down to 449 today. The patient is requesting to go home. There is only slight elevation in lipase, but she is tolerating the diet and she is requesting to go home. She has no abdominal pain. The patient will follow up with Dr. Candace Cruise.  ALT today is 270, AST is 145, and alkaline phosphatase is 188, improved from yesterday. Alkaline phosphatase was 227 yesterday, ALT 359, and AST 299. The patient's condition is stable. She needs to follow up with Dr. Candace Cruise in 1 to 2 weeks regarding brushing results of ERCP. Vital signs this morning: Temperature 98.2, pulse 50, respirations 20, blood pressure 145/61,  saturation 99% on room air.   TIME SPENT ON DISCHARGE PREPARATION: More than 30 minutes.   ____________________________ Epifanio Lesches, MD sk:bjt D: 03/09/2011 12:43:28 ET T: 03/09/2011 13:53:01 ET JOB#: [id number]  cc: Epifanio Lesches, MD, <Dictator> Meindert A. Brunetta Genera, MD Epifanio Lesches MD ELECTRONICALLY SIGNED 03/17/2011  9:16

## 2014-07-08 DIAGNOSIS — I10 Essential (primary) hypertension: Secondary | ICD-10-CM | POA: Diagnosis not present

## 2014-07-14 DIAGNOSIS — J4520 Mild intermittent asthma, uncomplicated: Secondary | ICD-10-CM | POA: Diagnosis not present

## 2014-07-14 DIAGNOSIS — Z1159 Encounter for screening for other viral diseases: Secondary | ICD-10-CM | POA: Diagnosis not present

## 2014-07-14 DIAGNOSIS — Z0001 Encounter for general adult medical examination with abnormal findings: Secondary | ICD-10-CM | POA: Diagnosis not present

## 2014-07-14 DIAGNOSIS — I10 Essential (primary) hypertension: Secondary | ICD-10-CM | POA: Diagnosis not present

## 2014-07-14 DIAGNOSIS — D649 Anemia, unspecified: Secondary | ICD-10-CM | POA: Diagnosis not present

## 2014-07-15 ENCOUNTER — Other Ambulatory Visit: Payer: Self-pay | Admitting: Internal Medicine

## 2014-07-15 DIAGNOSIS — Z1231 Encounter for screening mammogram for malignant neoplasm of breast: Secondary | ICD-10-CM

## 2014-07-23 DIAGNOSIS — E2839 Other primary ovarian failure: Secondary | ICD-10-CM | POA: Diagnosis not present

## 2014-08-08 ENCOUNTER — Ambulatory Visit
Admission: RE | Admit: 2014-08-08 | Discharge: 2014-08-08 | Disposition: A | Discharge status: Home / Self Care | Payer: Commercial Managed Care - HMO | Source: Ambulatory Visit | Attending: Internal Medicine | Admitting: Internal Medicine

## 2014-08-08 ENCOUNTER — Other Ambulatory Visit: Payer: Self-pay | Admitting: Internal Medicine

## 2014-08-08 DIAGNOSIS — Z1231 Encounter for screening mammogram for malignant neoplasm of breast: Secondary | ICD-10-CM | POA: Diagnosis not present

## 2014-09-09 DIAGNOSIS — H524 Presbyopia: Secondary | ICD-10-CM | POA: Diagnosis not present

## 2014-09-09 DIAGNOSIS — H5210 Myopia, unspecified eye: Secondary | ICD-10-CM | POA: Diagnosis not present

## 2014-09-17 ENCOUNTER — Encounter: Payer: Self-pay | Admitting: *Deleted

## 2014-09-18 ENCOUNTER — Encounter: Payer: Self-pay | Admitting: Certified Registered"

## 2014-09-18 ENCOUNTER — Ambulatory Visit
Admission: RE | Admit: 2014-09-18 | Discharge: 2014-09-18 | Disposition: A | Discharge status: Home / Self Care | Payer: Commercial Managed Care - HMO | Source: Ambulatory Visit | Attending: Gastroenterology | Admitting: Gastroenterology

## 2014-09-18 ENCOUNTER — Encounter
Admission: RE | Disposition: A | Discharge status: Home / Self Care | Payer: Self-pay | Source: Ambulatory Visit | Attending: Gastroenterology

## 2014-09-18 DIAGNOSIS — M1990 Unspecified osteoarthritis, unspecified site: Secondary | ICD-10-CM | POA: Diagnosis not present

## 2014-09-18 DIAGNOSIS — Z79899 Other long term (current) drug therapy: Secondary | ICD-10-CM | POA: Diagnosis not present

## 2014-09-18 DIAGNOSIS — D649 Anemia, unspecified: Secondary | ICD-10-CM | POA: Insufficient documentation

## 2014-09-18 DIAGNOSIS — J45909 Unspecified asthma, uncomplicated: Secondary | ICD-10-CM | POA: Insufficient documentation

## 2014-09-18 DIAGNOSIS — Z803 Family history of malignant neoplasm of breast: Secondary | ICD-10-CM | POA: Diagnosis not present

## 2014-09-18 DIAGNOSIS — Z7951 Long term (current) use of inhaled steroids: Secondary | ICD-10-CM | POA: Diagnosis not present

## 2014-09-18 DIAGNOSIS — I10 Essential (primary) hypertension: Secondary | ICD-10-CM | POA: Insufficient documentation

## 2014-09-18 DIAGNOSIS — Z1211 Encounter for screening for malignant neoplasm of colon: Secondary | ICD-10-CM | POA: Diagnosis not present

## 2014-09-18 HISTORY — DX: Anemia, unspecified: D64.9

## 2014-09-18 HISTORY — DX: Essential (primary) hypertension: I10

## 2014-09-18 HISTORY — PX: COLONOSCOPY WITH PROPOFOL: SHX5780

## 2014-09-18 HISTORY — DX: Unspecified osteoarthritis, unspecified site: M19.90

## 2014-09-18 HISTORY — DX: Unspecified asthma, uncomplicated: J45.909

## 2014-09-18 SURGERY — COLONOSCOPY WITH PROPOFOL
Anesthesia: General

## 2014-09-18 MED ORDER — MIDAZOLAM HCL 5 MG/5ML IJ SOLN
INTRAMUSCULAR | Status: AC
  Filled 2014-09-18: qty 5

## 2014-09-18 MED ORDER — FENTANYL CITRATE (PF) 100 MCG/2ML IJ SOLN
INTRAMUSCULAR | Status: AC
  Filled 2014-09-18: qty 2

## 2014-09-18 MED ORDER — SODIUM CHLORIDE 0.9 % IV SOLN
INTRAVENOUS | Status: DC
  Administered 2014-09-18: 11:00:00 via INTRAVENOUS

## 2014-09-18 MED ORDER — FENTANYL CITRATE (PF) 100 MCG/2ML IJ SOLN
INTRAMUSCULAR | Status: DC | PRN
  Administered 2014-09-18: 50 ug via INTRAVENOUS

## 2014-09-18 MED ORDER — MIDAZOLAM HCL 5 MG/5ML IJ SOLN
INTRAMUSCULAR | Status: DC | PRN
  Administered 2014-09-18 (×2): 2 mg via INTRAVENOUS

## 2014-09-18 NOTE — H&P (Signed)
Primary Care Physician:  Madelyn Brunner, MD Primary Gastroenterologist:  Dr. Candace Cruise  Pre-Procedure History & Physical: HPI:  Crystal Bailey is a 64 y.o. female is here for an colonoscopy.   Past Medical History  Diagnosis Date  . Anemia   . Hypertension   . Arthritis   . Asthma     No past surgical history on file.  Prior to Admission medications   Medication Sig Start Date End Date Taking? Authorizing Provider  acetaminophen (TYLENOL) 500 MG tablet Take 500 mg by mouth every 6 (six) hours as needed.   Yes Historical Provider, MD  albuterol (PROVENTIL HFA;VENTOLIN HFA) 108 (90 BASE) MCG/ACT inhaler Inhale into the lungs every 6 (six) hours as needed for wheezing or shortness of breath.   Yes Historical Provider, MD  cetirizine (ZYRTEC) 10 MG tablet Take 10 mg by mouth daily.   Yes Historical Provider, MD  Cholecalciferol (VITAMIN D3) 3000 UNITS TABS Take by mouth.   Yes Historical Provider, MD  cyclobenzaprine (FLEXERIL) 10 MG tablet Take 10 mg by mouth 3 (three) times daily as needed for muscle spasms.   Yes Historical Provider, MD  ferrous sulfate 325 (65 FE) MG tablet Take 325 mg by mouth daily with breakfast.   Yes Historical Provider, MD  Fluticasone-Salmeterol (ADVAIR) 250-50 MCG/DOSE AEPB Inhale 1 puff into the lungs 2 (two) times daily.   Yes Historical Provider, MD  meloxicam (MOBIC) 7.5 MG tablet Take 7.5 mg by mouth daily.   Yes Historical Provider, MD  omeprazole (PRILOSEC) 20 MG capsule Take 20 mg by mouth daily.   Yes Historical Provider, MD  oxybutynin (DITROPAN) 5 MG tablet Take 5 mg by mouth 3 (three) times daily.   Yes Historical Provider, MD  traMADol (ULTRAM) 50 MG tablet Take by mouth every 6 (six) hours as needed.   Yes Historical Provider, MD    Allergies as of 08/26/2014  . (Not on File)    Family History  Problem Relation Age of Onset  . Breast cancer Mother   . Breast cancer Sister     Social History   Social History  . Marital Status:  Single    Spouse Name: N/A  . Number of Children: N/A  . Years of Education: N/A   Occupational History  . Not on file.   Social History Main Topics  . Smoking status: Not on file  . Smokeless tobacco: Not on file  . Alcohol Use: Not on file  . Drug Use: Not on file  . Sexual Activity: Not on file   Other Topics Concern  . Not on file   Social History Narrative    Review of Systems: See HPI, otherwise negative ROS  Physical Exam: BP 162/79 mmHg  Pulse 57  Temp(Src) 97.8 F (36.6 C) (Tympanic)  Resp 16  Ht 5' (1.524 m)  Wt 62.143 kg (137 lb)  BMI 26.76 kg/m2  SpO2 100% General:   Alert,  pleasant and cooperative in NAD Head:  Normocephalic and atraumatic. Neck:  Supple; no masses or thyromegaly. Lungs:  Clear throughout to auscultation.    Heart:  Regular rate and rhythm. Abdomen:  Soft, nontender and nondistended. Normal bowel sounds, without guarding, and without rebound.   Neurologic:  Alert and  oriented x4;  grossly normal neurologically.  Impression/Plan: Crystal Bailey is here for an colonoscopy to be performed for screening.  Risks, benefits, limitations, and alternatives regarding colonoscopy have been reviewed with the patient.  Questions have been  answered.  All parties agreeable.   Garl Speigner, Lupita Dawn, MD  09/18/2014, 10:58 AM

## 2014-09-18 NOTE — Op Note (Signed)
[facility] Gastroenterology Patient Name: Crystal Bailey Procedure Date: 09/18/2014 11:42 AM MRN: [id number] Account #: [id number] Date of Birth: July 17, 1950 Admit Type: Outpatient Age: 64 Room: [facility] ENDO ROOM 4 Gender: Female Note Status: Finalized Procedure:         Colonoscopy Indications:       Screening for colorectal malignant neoplasm Providers:         Lupita Dawn. Candace Cruise, MD Referring MD:      Hewitt Blade. Sarina Ser, MD (Referring MD) Medicines:         Midazolam 4 mg IV, Fentanyl 100 micrograms IV Complications:     No immediate complications. Procedure:         Pre-Anesthesia Assessment:                    - Prior to the procedure, a History and Physical was                     performed, and patient medications, allergies and                     sensitivities were reviewed. The patient's tolerance of                     previous anesthesia was reviewed.                    - The risks and benefits of the procedure and the sedation                     options and risks were discussed with the patient. All                     questions were answered and informed consent was obtained.                    - After reviewing the risks and benefits, the patient was                     deemed in satisfactory condition to undergo the procedure.                    After obtaining informed consent, the colonoscope was                     passed under direct vision. Throughout the procedure, the                     patient's blood pressure, pulse, and oxygen saturations                     were monitored continuously. The Colonoscope was                     introduced through the anus and advanced to the the cecum,                     identified by appendiceal orifice and ileocecal valve. The                     colonoscopy was performed with difficulty due to a                     tortuous colon. Successful completion of the procedure was  aided by using  manual pressure. Findings:      The colon (entire examined portion) appeared normal. Impression:        - The entire examined colon is normal.                    - No specimens collected. Recommendation:    - Discharge patient to home.                    - Repeat colonoscopy in 10 years for surveillance.                    - The findings and recommendations were discussed with the                     patient. Procedure Code(s): --- Professional ---                    [phone], Colonoscopy, flexible; diagnostic, including                     collection of specimen(s) by brushing or washing, when                     performed (separate procedure) Diagnosis Code(s): --- Professional ---                    Z12.11, Encounter for screening for malignant neoplasm of                     colon CPT copyright 2014 [employer]. All rights reserved. The codes documented in this report are preliminary and upon coder review may  be revised to meet current compliance requirements. Hulen Luster, MD 09/18/2014 12:27:45 PM This report has been signed electronically. Number of Addenda: 0 Note Initiated On: 09/18/2014 11:42 AM Scope Withdrawal Time: 0 hours 3 minutes 30 seconds  Total Procedure Duration: 0 hours 14 minutes 10 seconds       [facility]

## 2014-09-19 ENCOUNTER — Encounter: Payer: Self-pay | Admitting: Gastroenterology

## 2015-01-05 DIAGNOSIS — I10 Essential (primary) hypertension: Secondary | ICD-10-CM | POA: Diagnosis not present

## 2015-01-05 DIAGNOSIS — D649 Anemia, unspecified: Secondary | ICD-10-CM | POA: Diagnosis not present

## 2015-01-12 DIAGNOSIS — I10 Essential (primary) hypertension: Secondary | ICD-10-CM | POA: Diagnosis not present

## 2015-01-12 DIAGNOSIS — J4520 Mild intermittent asthma, uncomplicated: Secondary | ICD-10-CM | POA: Diagnosis not present

## 2015-01-12 DIAGNOSIS — M1991 Primary osteoarthritis, unspecified site: Secondary | ICD-10-CM | POA: Diagnosis not present

## 2015-03-26 DIAGNOSIS — J042 Acute laryngotracheitis: Secondary | ICD-10-CM | POA: Diagnosis not present

## 2015-06-29 ENCOUNTER — Other Ambulatory Visit: Payer: Self-pay | Admitting: Internal Medicine

## 2015-06-29 DIAGNOSIS — Z1231 Encounter for screening mammogram for malignant neoplasm of breast: Secondary | ICD-10-CM

## 2015-07-08 DIAGNOSIS — I10 Essential (primary) hypertension: Secondary | ICD-10-CM | POA: Diagnosis not present

## 2015-07-15 ENCOUNTER — Other Ambulatory Visit: Payer: Self-pay | Admitting: Internal Medicine

## 2015-07-15 DIAGNOSIS — J4520 Mild intermittent asthma, uncomplicated: Secondary | ICD-10-CM | POA: Diagnosis not present

## 2015-07-15 DIAGNOSIS — R3129 Other microscopic hematuria: Secondary | ICD-10-CM | POA: Diagnosis not present

## 2015-07-15 DIAGNOSIS — I10 Essential (primary) hypertension: Secondary | ICD-10-CM | POA: Diagnosis not present

## 2015-07-15 DIAGNOSIS — Z85528 Personal history of other malignant neoplasm of kidney: Secondary | ICD-10-CM | POA: Diagnosis not present

## 2015-07-15 DIAGNOSIS — G8929 Other chronic pain: Secondary | ICD-10-CM | POA: Diagnosis not present

## 2015-07-15 DIAGNOSIS — M545 Low back pain: Secondary | ICD-10-CM | POA: Diagnosis not present

## 2015-07-15 DIAGNOSIS — D649 Anemia, unspecified: Secondary | ICD-10-CM | POA: Diagnosis not present

## 2015-07-15 DIAGNOSIS — Z1231 Encounter for screening mammogram for malignant neoplasm of breast: Secondary | ICD-10-CM | POA: Diagnosis not present

## 2015-07-20 ENCOUNTER — Ambulatory Visit
Admission: RE | Admit: 2015-07-20 | Discharge: 2015-07-20 | Disposition: A | Discharge status: Home / Self Care | Payer: Commercial Managed Care - HMO | Source: Ambulatory Visit | Attending: Internal Medicine | Admitting: Internal Medicine

## 2015-07-20 DIAGNOSIS — Z905 Acquired absence of kidney: Secondary | ICD-10-CM | POA: Insufficient documentation

## 2015-07-20 DIAGNOSIS — R3129 Other microscopic hematuria: Secondary | ICD-10-CM | POA: Diagnosis not present

## 2015-07-20 DIAGNOSIS — N281 Cyst of kidney, acquired: Secondary | ICD-10-CM | POA: Diagnosis not present

## 2015-08-10 ENCOUNTER — Ambulatory Visit
Admission: RE | Admit: 2015-08-10 | Discharge: 2015-08-10 | Disposition: A | Discharge status: Home / Self Care | Payer: Commercial Managed Care - HMO | Source: Ambulatory Visit | Attending: Internal Medicine | Admitting: Internal Medicine

## 2015-08-10 ENCOUNTER — Other Ambulatory Visit: Payer: Self-pay | Admitting: Internal Medicine

## 2015-08-10 DIAGNOSIS — Z1231 Encounter for screening mammogram for malignant neoplasm of breast: Secondary | ICD-10-CM

## 2015-08-10 HISTORY — DX: Malignant (primary) neoplasm, unspecified: C80.1

## 2015-10-09 DIAGNOSIS — I10 Essential (primary) hypertension: Secondary | ICD-10-CM | POA: Diagnosis not present

## 2015-10-09 DIAGNOSIS — M19041 Primary osteoarthritis, right hand: Secondary | ICD-10-CM | POA: Diagnosis not present

## 2016-05-09 ENCOUNTER — Other Ambulatory Visit: Payer: Self-pay | Admitting: Podiatry

## 2016-06-02 ENCOUNTER — Encounter
Admission: RE | Admit: 2016-06-02 | Discharge: 2016-06-02 | Disposition: A | Discharge status: Home / Self Care | Payer: Medicare HMO | Source: Ambulatory Visit | Attending: Podiatry | Admitting: Podiatry

## 2016-06-02 DIAGNOSIS — Z01810 Encounter for preprocedural cardiovascular examination: Secondary | ICD-10-CM | POA: Insufficient documentation

## 2016-06-02 DIAGNOSIS — Z01818 Encounter for other preprocedural examination: Secondary | ICD-10-CM | POA: Diagnosis not present

## 2016-06-02 DIAGNOSIS — Z01812 Encounter for preprocedural laboratory examination: Secondary | ICD-10-CM | POA: Insufficient documentation

## 2016-06-02 HISTORY — DX: Unspecified urinary incontinence: R32

## 2016-06-02 HISTORY — DX: Pain in unspecified hip: M25.559

## 2016-06-02 HISTORY — DX: Gastro-esophageal reflux disease without esophagitis: K21.9

## 2016-06-02 HISTORY — DX: Anxiety disorder, unspecified: F41.9

## 2016-06-02 LAB — CBC
HEMATOCRIT: 36.3 % (ref 35.0–47.0)
Hemoglobin: 12 g/dL (ref 12.0–16.0)
MCH: 25.8 pg — ABNORMAL LOW (ref 26.0–34.0)
MCHC: 33 g/dL (ref 32.0–36.0)
MCV: 78.2 fL — AB (ref 80.0–100.0)
Platelets: 219 10*3/uL (ref 150–440)
RBC: 4.64 MIL/uL (ref 3.80–5.20)
RDW: 14.2 % (ref 11.5–14.5)
WBC: 5.4 10*3/uL (ref 3.6–11.0)

## 2016-06-02 NOTE — Patient Instructions (Signed)
Your procedure is scheduled on: 06/10/16 Fri Report to Same Day Surgery 2nd floor medical mall [facility] Entrance-take elevator on left to 2nd floor.  Check in with surgery information desk.) To find out your arrival time please call [phone] between 1PM - 3PM on 06/09/16 Thurs  Remember: Instructions that are not followed completely may result in serious medical risk, up to and including death, or upon the discretion of your surgeon and anesthesiologist your surgery may need to be rescheduled.    _x___ 1. Do not eat food or drink liquids after midnight. No gum chewing or                              hard candies.     __x__ 2. No Alcohol for 24 hours before or after surgery.   __x__3. No Smoking for 24 prior to surgery.   ____  4. Bring all medications with you on the day of surgery if instructed.    __x__ 5. Notify your doctor if there is any change in your medical condition     (cold, fever, infections).     Do not wear jewelry, make-up, hairpins, clips or nail polish.  Do not wear lotions, powders, or perfumes. You may wear deodorant.  Do not shave 48 hours prior to surgery. Men may shave face and neck.  Do not bring valuables to the hospital.    [facility] is not responsible for any belongings or valuables.               Contacts, dentures or bridgework may not be worn into surgery.  Leave your suitcase in the car. After surgery it may be brought to your room.  For patients admitted to the hospital, discharge time is determined by your                       treatment team.   Patients discharged the day of surgery will not be allowed to drive home.  You will need someone to drive you home and stay with you the night of your procedure.    Please read over the following fact sheets that you were given:   [facility] Preparing for Surgery and or MRSA Information   _x___ Take anti-hypertensive (unless it includes a diuretic), cardiac, seizure, asthma,     anti-reflux and  psychiatric medicines. These include:  1. albuterol (PROVENTIL   2.omeprazole (PRILOSEC OTC  3.traMADol (ULTRAM) 50 MG  If needed  4.  5.  6.  ____Fleets enema or Magnesium Citrate as directed.   _x___ Use CHG Soap or sage wipes as directed on instruction sheet   ____ Use inhalers on the day of surgery and bring to hospital day of surgery  ____ Stop Metformin and Janumet 2 days prior to surgery.    ____ Take 1/2 of usual insulin dose the night before surgery and none on the morning     surgery.   _x___ Follow recommendations from Cardiologist, Pulmonologist or PCP regarding          stopping Aspirin, Coumadin, Pllavix ,Eliquis, Effient, or Pradaxa, and Pletal.  X____Stop Anti-inflammatories such as Advil, Aleve, Ibuprofen, Motrin, Naproxen, Naprosyn, Goodies powders or aspirin products. OK to take Tylenol and                          Celebrex.   _x___ Stop supplements until  after surgery.  But may continue Vitamin D, Vitamin B,       and multivitamin.   ____ Bring C-Pap to the hospital.

## 2016-06-02 NOTE — Pre-Procedure Instructions (Signed)
MEDICAL OPTIMIZATION SSHEET/ EKG,AS INSTRUCTED BY DR P CARROLL, CALLED AND FAXED TO DR Vonzella Nipple. SPOKE WITH DAWN

## 2016-06-02 NOTE — Pre-Procedure Instructions (Signed)
Abnormal EKG, showed to [facility] with anesthesia.

## 2016-06-07 NOTE — Pre-Procedure Instructions (Signed)
[place]

## 2016-06-09 MED ORDER — CEFAZOLIN SODIUM-DEXTROSE 2-4 GM/100ML-% IV SOLN
2.0000 g | INTRAVENOUS | Status: AC
  Administered 2016-06-10: 2 g via INTRAVENOUS

## 2016-06-10 ENCOUNTER — Ambulatory Visit: Payer: Medicare HMO | Admitting: Certified Registered Nurse Anesthetist

## 2016-06-10 ENCOUNTER — Ambulatory Visit
Admission: RE | Admit: 2016-06-10 | Discharge: 2016-06-10 | Disposition: A | Discharge status: Home / Self Care | Payer: Medicare HMO | Source: Ambulatory Visit | Attending: Podiatry | Admitting: Podiatry

## 2016-06-10 ENCOUNTER — Encounter
Admission: RE | Disposition: A | Discharge status: Home / Self Care | Payer: Self-pay | Source: Ambulatory Visit | Attending: Podiatry

## 2016-06-10 DIAGNOSIS — M2012 Hallux valgus (acquired), left foot: Secondary | ICD-10-CM | POA: Insufficient documentation

## 2016-06-10 DIAGNOSIS — K219 Gastro-esophageal reflux disease without esophagitis: Secondary | ICD-10-CM | POA: Insufficient documentation

## 2016-06-10 DIAGNOSIS — Z791 Long term (current) use of non-steroidal anti-inflammatories (NSAID): Secondary | ICD-10-CM | POA: Diagnosis not present

## 2016-06-10 DIAGNOSIS — F17210 Nicotine dependence, cigarettes, uncomplicated: Secondary | ICD-10-CM | POA: Diagnosis not present

## 2016-06-10 DIAGNOSIS — Z905 Acquired absence of kidney: Secondary | ICD-10-CM | POA: Insufficient documentation

## 2016-06-10 DIAGNOSIS — J45909 Unspecified asthma, uncomplicated: Secondary | ICD-10-CM | POA: Insufficient documentation

## 2016-06-10 DIAGNOSIS — Z79899 Other long term (current) drug therapy: Secondary | ICD-10-CM | POA: Diagnosis not present

## 2016-06-10 DIAGNOSIS — Z85528 Personal history of other malignant neoplasm of kidney: Secondary | ICD-10-CM | POA: Diagnosis not present

## 2016-06-10 DIAGNOSIS — D649 Anemia, unspecified: Secondary | ICD-10-CM | POA: Diagnosis not present

## 2016-06-10 DIAGNOSIS — F17290 Nicotine dependence, other tobacco product, uncomplicated: Secondary | ICD-10-CM | POA: Insufficient documentation

## 2016-06-10 DIAGNOSIS — I10 Essential (primary) hypertension: Secondary | ICD-10-CM | POA: Diagnosis not present

## 2016-06-10 HISTORY — PX: HALLUX VALGUS AUSTIN: SHX6623

## 2016-06-10 HISTORY — PX: HALLUX VALGUS AKIN: SHX6622

## 2016-06-10 SURGERY — CORRECTION, HALLUX VALGUS
Anesthesia: General | Site: Foot | Laterality: Left | Wound class: Clean

## 2016-06-10 MED ORDER — ROPIVACAINE HCL 5 MG/ML IJ SOLN
INTRAMUSCULAR | Status: DC | PRN
  Administered 2016-06-10: 30 mL via PERINEURAL

## 2016-06-10 MED ORDER — SEVOFLURANE IN SOLN
RESPIRATORY_TRACT | Status: AC
  Filled 2016-06-10: qty 250

## 2016-06-10 MED ORDER — ONDANSETRON HCL 4 MG/2ML IJ SOLN
INTRAMUSCULAR | Status: DC | PRN
  Administered 2016-06-10: 4 mg via INTRAVENOUS

## 2016-06-10 MED ORDER — FENTANYL CITRATE (PF) 100 MCG/2ML IJ SOLN: 25.0000 ug | INTRAMUSCULAR | Status: DC | PRN

## 2016-06-10 MED ORDER — BUPIVACAINE HCL (PF) 0.5 % IJ SOLN
INTRAMUSCULAR | Status: DC | PRN
  Administered 2016-06-10: 10 mL

## 2016-06-10 MED ORDER — BUPIVACAINE HCL (PF) 0.5 % IJ SOLN
INTRAMUSCULAR | Status: AC
  Filled 2016-06-10: qty 30

## 2016-06-10 MED ORDER — PROPOFOL 10 MG/ML IV BOLUS
INTRAVENOUS | Status: DC | PRN
Start: 2016-06-10 — End: 2016-06-10
  Administered 2016-06-10: 30 mg via INTRAVENOUS
  Administered 2016-06-10: 20 mg via INTRAVENOUS

## 2016-06-10 MED ORDER — ONDANSETRON HCL 4 MG PO TABS: 4.0000 mg | ORAL_TABLET | Freq: Four times a day (QID) | ORAL | Status: DC | PRN

## 2016-06-10 MED ORDER — LIDOCAINE HCL (PF) 1 % IJ SOLN
INTRAMUSCULAR | Status: DC | PRN
  Administered 2016-06-10: 3 mL

## 2016-06-10 MED ORDER — HYDROCODONE-ACETAMINOPHEN 5-325 MG PO TABS: 1.0000 | ORAL_TABLET | ORAL | Status: DC | PRN

## 2016-06-10 MED ORDER — PROPOFOL 500 MG/50ML IV EMUL
INTRAVENOUS | Status: AC
  Filled 2016-06-10: qty 50

## 2016-06-10 MED ORDER — PROMETHAZINE HCL 25 MG/ML IJ SOLN: 6.2500 mg | INTRAMUSCULAR | Status: DC | PRN

## 2016-06-10 MED ORDER — MIDAZOLAM HCL 2 MG/2ML IJ SOLN
INTRAMUSCULAR | Status: AC
  Administered 2016-06-10: 2 mg via INTRAVENOUS
  Filled 2016-06-10: qty 2

## 2016-06-10 MED ORDER — DEXAMETHASONE SODIUM PHOSPHATE 10 MG/ML IJ SOLN
INTRAMUSCULAR | Status: DC | PRN
  Administered 2016-06-10: 5 mg via INTRAVENOUS

## 2016-06-10 MED ORDER — ACETAMINOPHEN 10 MG/ML IV SOLN
INTRAVENOUS | Status: AC
  Filled 2016-06-10: qty 100

## 2016-06-10 MED ORDER — MIDAZOLAM HCL 2 MG/2ML IJ SOLN
2.0000 mg | Freq: Once | INTRAMUSCULAR | Status: AC
  Administered 2016-06-10: 2 mg via INTRAVENOUS

## 2016-06-10 MED ORDER — LACTATED RINGERS IV SOLN
INTRAVENOUS | Status: DC
  Administered 2016-06-10: 1000 mL via INTRAVENOUS

## 2016-06-10 MED ORDER — OXYCODONE HCL 5 MG PO TABS: 5.0000 mg | ORAL_TABLET | Freq: Once | ORAL | Status: DC | PRN

## 2016-06-10 MED ORDER — MIDAZOLAM HCL 2 MG/2ML IJ SOLN
INTRAMUSCULAR | Status: AC
  Filled 2016-06-10: qty 2

## 2016-06-10 MED ORDER — MEPERIDINE HCL 50 MG/ML IJ SOLN: 6.2500 mg | INTRAMUSCULAR | Status: DC | PRN

## 2016-06-10 MED ORDER — FENTANYL CITRATE (PF) 100 MCG/2ML IJ SOLN
INTRAMUSCULAR | Status: AC
  Filled 2016-06-10: qty 2

## 2016-06-10 MED ORDER — CEFAZOLIN SODIUM-DEXTROSE 2-4 GM/100ML-% IV SOLN
INTRAVENOUS | Status: AC
  Filled 2016-06-10: qty 100

## 2016-06-10 MED ORDER — LIDOCAINE HCL (CARDIAC) 20 MG/ML IV SOLN
INTRAVENOUS | Status: DC | PRN
  Administered 2016-06-10: 30 mg via INTRAVENOUS

## 2016-06-10 MED ORDER — DEXAMETHASONE SODIUM PHOSPHATE 10 MG/ML IJ SOLN
INTRAMUSCULAR | Status: AC
  Filled 2016-06-10: qty 1

## 2016-06-10 MED ORDER — LIDOCAINE HCL (PF) 1 % IJ SOLN
INTRAMUSCULAR | Status: AC
  Filled 2016-06-10: qty 30

## 2016-06-10 MED ORDER — CHLORHEXIDINE GLUCONATE 4 % EX LIQD: 60.0000 mL | Freq: Once | CUTANEOUS | Status: DC

## 2016-06-10 MED ORDER — HYDROCODONE-ACETAMINOPHEN 5-325 MG PO TABS: 1.0000 | ORAL_TABLET | Freq: Four times a day (QID) | ORAL | 0 refills | Status: DC | PRN

## 2016-06-10 MED ORDER — ONDANSETRON HCL 4 MG/2ML IJ SOLN
INTRAMUSCULAR | Status: AC
  Filled 2016-06-10: qty 2

## 2016-06-10 MED ORDER — LIDOCAINE HCL 1 % IJ SOLN
INTRAMUSCULAR | Status: DC | PRN
  Administered 2016-06-10: 10 mL

## 2016-06-10 MED ORDER — ROPIVACAINE HCL 5 MG/ML IJ SOLN
INTRAMUSCULAR | Status: AC
  Filled 2016-06-10: qty 30

## 2016-06-10 MED ORDER — ONDANSETRON HCL 4 MG/2ML IJ SOLN: 4.0000 mg | Freq: Four times a day (QID) | INTRAMUSCULAR | Status: DC | PRN

## 2016-06-10 MED ORDER — OXYCODONE HCL 5 MG/5ML PO SOLN: 5.0000 mg | Freq: Once | ORAL | Status: DC | PRN

## 2016-06-10 MED ORDER — LIDOCAINE HCL (PF) 1 % IJ SOLN
INTRAMUSCULAR | Status: AC
  Filled 2016-06-10: qty 5

## 2016-06-10 MED ORDER — ACETAMINOPHEN 10 MG/ML IV SOLN
INTRAVENOUS | Status: DC | PRN
  Administered 2016-06-10: 1000 mg via INTRAVENOUS

## 2016-06-10 MED ORDER — FENTANYL CITRATE (PF) 100 MCG/2ML IJ SOLN
INTRAMUSCULAR | Status: DC | PRN
  Administered 2016-06-10 (×2): 25 ug via INTRAVENOUS

## 2016-06-10 MED ORDER — LIDOCAINE HCL (PF) 2 % IJ SOLN
INTRAMUSCULAR | Status: AC
  Filled 2016-06-10: qty 2

## 2016-06-10 MED ORDER — LIDOCAINE-EPINEPHRINE 1 %-1:100000 IJ SOLN
INTRAMUSCULAR | Status: AC
  Filled 2016-06-10: qty 1

## 2016-06-10 MED ORDER — PROPOFOL 500 MG/50ML IV EMUL
INTRAVENOUS | Status: DC | PRN
  Administered 2016-06-10: 35 ug/kg/min via INTRAVENOUS

## 2016-06-10 SURGICAL SUPPLY — 44 items
BANDAGE ELASTIC 4 LF NS (GAUZE/BANDAGES/DRESSINGS) ×2 IMPLANT
BANDAGE STRETCH 3X4.1 STRL (GAUZE/BANDAGES/DRESSINGS) ×6 IMPLANT
BLADE MED AGGRESSIVE (BLADE) ×2 IMPLANT
BLADE OSC/SAGITTAL MD 5.5X18 (BLADE) IMPLANT
BLADE OSC/SAGITTAL MD 9X18.5 (BLADE) IMPLANT
BLADE OSCILLATING/SAGITTAL (BLADE)
BLADE SURG 15 STRL LF DISP TIS (BLADE) ×2 IMPLANT
BLADE SURG 15 STRL SS (BLADE) ×2
BLADE SURG MINI STRL (BLADE) ×4 IMPLANT
BLADE SW THK.38XMED LNG THN (BLADE) IMPLANT
BNDG ESMARK 4X12 TAN STRL LF (GAUZE/BANDAGES/DRESSINGS) ×2 IMPLANT
BNDG GAUZE 4.5X4.1 6PLY STRL (MISCELLANEOUS) ×2 IMPLANT
CANISTER SUCT 1200ML W/VALVE (MISCELLANEOUS) ×2 IMPLANT
CUFF TOURN DUAL PL 12 NO SLV (MISCELLANEOUS) ×2 IMPLANT
DRAPE FLUOR MINI C-ARM 54X84 (DRAPES) ×2 IMPLANT
DURAPREP 26ML APPLICATOR (WOUND CARE) ×2 IMPLANT
ELECT REM PT RETURN 9FT ADLT (ELECTROSURGICAL) ×2
ELECTRODE REM PT RTRN 9FT ADLT (ELECTROSURGICAL) ×1 IMPLANT
GAUZE PETRO XEROFOAM 1X8 (MISCELLANEOUS) ×2 IMPLANT
GAUZE SPONGE 4X4 12PLY STRL (GAUZE/BANDAGES/DRESSINGS) ×2 IMPLANT
GLOVE BIO SURGEON STRL SZ7.5 (GLOVE) ×2 IMPLANT
GLOVE INDICATOR 8.0 STRL GRN (GLOVE) ×2 IMPLANT
GOWN STRL REUS W/ TWL LRG LVL3 (GOWN DISPOSABLE) ×2 IMPLANT
GOWN STRL REUS W/TWL LRG LVL3 (GOWN DISPOSABLE) ×2
KIT RM TURNOVER STRD PROC AR (KITS) ×2 IMPLANT
LABEL OR SOLS (LABEL) ×2 IMPLANT
NEEDLE HYPO 25X1 1.5 SAFETY (NEEDLE) ×4 IMPLANT
NS IRRIG 500ML POUR BTL (IV SOLUTION) ×2 IMPLANT
PACK EXTREMITY ARMC (MISCELLANEOUS) ×2 IMPLANT
PENCIL ELECTRO HAND CTR (MISCELLANEOUS) ×2 IMPLANT
RASP SM TEAR CROSS CUT (RASP) ×2 IMPLANT
SPLINT CAST 1 STEP 5X30 WHT (MISCELLANEOUS) IMPLANT
STAPLE NIT SUPER 11X10X10 (Staple) ×2 IMPLANT
STOCKINETTE STRL 6IN 960660 (GAUZE/BANDAGES/DRESSINGS) ×2 IMPLANT
STRIP CLOSURE SKIN 1/4X4 (GAUZE/BANDAGES/DRESSINGS) ×2 IMPLANT
SUT ETHILON 5-0 FS-2 18 BLK (SUTURE) ×2 IMPLANT
SUT MON AB 5-0 P3 18 (SUTURE) ×2 IMPLANT
SUT VIC AB 3-0 SH 27 (SUTURE) ×1
SUT VIC AB 3-0 SH 27X BRD (SUTURE) ×1 IMPLANT
SUT VIC AB 4-0 FS2 27 (SUTURE) ×2 IMPLANT
SWABSTK COMLB BENZOIN TINCTURE (MISCELLANEOUS) ×2 IMPLANT
SYRINGE 10CC LL (SYRINGE) ×2 IMPLANT
WIRE Z .045 C-WIRE SPADE TIP (WIRE) ×2 IMPLANT
WIRE Z .062 C-WIRE SPADE TIP (WIRE) ×2 IMPLANT

## 2016-06-10 NOTE — Op Note (Signed)
Operative note   Surgeon:Ren Grasse Lawyer: None    Preop diagnosis: Valgus deformity left foot    Postop diagnosis: Same    Procedure: [place] with Akin double osteotomy left foot hallux varus correction    EBL: Minimal    Anesthesia:regional and IV sedation popliteal block supplemented with local around saphenous nerve.    Hemostasis: Ankle tourniquet inflated to 200 mmHg for approximately 50 minutes    Specimen: None    Complications: None    Operative indications:Crystal Bailey is an 66 y.o. that presents today for surgical intervention.  The risks/benefits/alternatives/complications have been discussed and consent has been given.    Procedure:  Patient was brought into the OR and placed on the operating table in thesupine position. After anesthesia was obtained theleft lower extremity was prepped and draped in usual sterile fashion.  Attention was directed to the dorsal medial left first MTPJ where a dorsal medial longitudinal incision was performed. Sharp and blunt dissection carried down to the capsule. A T capsulotomy was then performed. The dorsomedial eminence was noted and transected with a power saw. Next a V osteotomy was created. The capital fragment was translocated laterally through the metatarsal head. This was stabilized with a 0.062 K wire. Prominent dorsal medial eminence and overhanging ledge was then transected with a power saw. Good stability was noted. Mild residual valgus deformity of the great toe was noted. At this time I elected to perform an Akin osteotomy. Dissection was taken to the base of the proximal phalanx. Next a small wedge with the apex lateral was performed on the base of proximal phalanx. A small compression bone staple was applied with good compression and realignment noted. At this point the wound was flushed with copious amounts or irrigation. Closure was performed with a 3-0 Vicryl for the deeper layer. A small capsulorrhaphy was  performed. 4-0 Vicryl for the subcutaneous tissue and a 5-0 Monocryl undyed for skin.    Patient tolerated the procedure and anesthesia well.  Was transported from the OR to the PACU with all vital signs stable and vascular status intact. To be discharged per routine protocol.  Will follow up in approximately 1 week in the outpatient clinic. Patient was discharged with Norco prescription.

## 2016-06-10 NOTE — Discharge Instructions (Signed)
[facility] °[facility] ° °POST OPERATIVE INSTRUCTIONS FOR DR. TROXLER AND DR. FOWLER °[facility] ° ° °1. Take your medication as prescribed.  Pain medication should be taken only as needed. ° °2. Keep the dressing clean, dry and intact. ° °3. Keep your foot elevated above the heart level for the first 48 hours. ° °4. Walking to the bathroom and brief periods of walking are acceptable, unless we have instructed you to be non-weight bearing. ° °5. Always wear your post-op shoe when walking.  Always use your crutches if you are to be non-weight bearing. ° °6. Do not take a shower. Baths are permissible as long as the foot is kept out of the water.  ° °7. Every hour you are awake:  °- Bend your knee 15 times. °- Flex foot 15 times °- Massage calf 15 times ° °8. Call [facility] ([phone]) if any of the following problems occur: °- You develop a temperature or fever. °- The bandage becomes saturated with blood. °- Medication does not stop your pain. °- Injury of the foot occurs. °- Any symptoms of infection including redness, odor, or red streaks running from wound. ° ° ° ° °AMBULATORY SURGERY  °DISCHARGE INSTRUCTIONS ° ° °1) The drugs that you were given will stay in your system until tomorrow so for the next 24 hours you should not: ° °A) Drive an automobile °B) Make any legal decisions °C) Drink any alcoholic beverage ° ° °2) You may resume regular meals tomorrow.  Today it is better to start with liquids and gradually work up to solid foods. ° °You may eat anything you prefer, but it is better to start with liquids, then soup and crackers, and gradually work up to solid foods. ° ° °3) Please notify your doctor immediately if you have any unusual bleeding, trouble breathing, redness and pain at the surgery site, drainage, fever, or pain not relieved by medication. ° ° ° °4) Additional Instructions: ° ° ° ° ° ° ° °Please contact your physician with any  problems or Same Day Surgery at [phone], Monday through Friday 6 am to 4 pm, or [place] [facility] [place] Main number at [phone]. ° °

## 2016-06-10 NOTE — Anesthesia Preprocedure Evaluation (Signed)
Anesthesia Evaluation  Patient identified by MRN, date of birth, ID band Patient awake    Reviewed: Allergy & Precautions, NPO status , Patient's Chart, lab work & pertinent test results  History of Anesthesia Complications Negative for: history of anesthetic complications  Airway Mallampati: II  TM Distance: >3 FB Neck ROM: Full    Dental  (+) Missing, Poor Dentition   Pulmonary asthma (uses daily controller) , Current Smoker,    breath sounds clear to auscultation- rhonchi (-) wheezing      Cardiovascular hypertension, Pt. on medications (-) CAD and (-) Past MI  Rhythm:Regular Rate:Normal - Systolic murmurs and - Diastolic murmurs    Neuro/Psych Anxiety negative neurological ROS     GI/Hepatic Neg liver ROS, GERD  ,  Endo/Other  negative endocrine ROSneg diabetes  Renal/GU negative Renal ROS     Musculoskeletal  (+) Arthritis ,   Abdominal (+) - obese,   Peds  Hematology  (+) anemia ,   Anesthesia Other Findings Past Medical History: No date: Anemia No date: Anxiety No date: Arthritis No date: Asthma 2011: Cancer ([place])     Comment: kidney No date: GERD (gastroesophageal reflux disease) No date: Hip pain     Comment: Lt hip No date: Hypertension No date: Lack of bladder control   Reproductive/Obstetrics                             Anesthesia Physical Anesthesia Plan  ASA: II  Anesthesia Plan: General   Post-op Pain Management:  Regional for Post-op pain   Induction: Intravenous  Airway Management Planned: Natural Airway  Additional Equipment:   Intra-op Plan:   Post-operative Plan:   Informed Consent: I have reviewed the patients History and Physical, chart, labs and discussed the procedure including the risks, benefits and alternatives for the proposed anesthesia with the patient or authorized representative who has indicated his/her understanding and acceptance.    Dental advisory given  Plan Discussed with: CRNA and Anesthesiologist  Anesthesia Plan Comments:         Anesthesia Quick Evaluation

## 2016-06-10 NOTE — Anesthesia Procedure Notes (Signed)
Date/Time: 06/10/2016 7:35 AM Performed by: Johnna Acosta Pre-anesthesia Checklist: Patient identified, Emergency Drugs available, Suction available, Patient being monitored and Timeout performed Patient Re-evaluated:Patient Re-evaluated prior to inductionPreoxygenation: Pre-oxygenation with 100% oxygen

## 2016-06-10 NOTE — H&P (Signed)
HISTORY AND PHYSICAL INTERVAL NOTE:  06/10/2016  7:19 AM  Crystal Bailey  has presented today for surgery, with the diagnosis of Hallux Valgus-Left  M20.12.  The various methods of treatment have been discussed with the patient.  No guarantees were given.  After consideration of risks, benefits and other options for treatment, the patient has consented to surgery.  I have reviewed the patients' chart and labs.    Patient Vitals for the past 24 hrs:  BP Temp Temp src Pulse Resp SpO2 Height Weight  06/10/16 0717 124/73 - - 71 18 100 % - -  06/10/16 0712 130/86 - - 64 (!) 23 100 % - -  06/10/16 0707 130/67 97.5 F (36.4 C) - (!) 59 (!) 27 99 % - -  06/10/16 0630 (!) 152/72 - - - - - - -  06/10/16 0623 - 97.5 F (36.4 C) Tympanic (!) 56 14 99 % 5' (1.524 m) 56.2 kg (124 lb)    A history and physical examination was performed in my office.  The patient was reexamined.  There have been no changes to this history and physical examination.  Samara Deist A

## 2016-06-10 NOTE — Anesthesia Procedure Notes (Signed)
Anesthesia Regional Block: Popliteal block   Pre-Anesthetic Checklist: ,, timeout performed, Correct Patient, Correct Site, Correct Laterality, Correct Procedure, Correct Position, site marked, Risks and benefits discussed,  Surgical consent,  Pre-op evaluation,  At surgeon's request and post-op pain management  Laterality: Left  Prep: chloraprep       Needles:  Injection technique: Single-shot  Needle Type: Echogenic Needle     Needle Length: 9cm  Needle Gauge: 21     Additional Needles:   Procedures: ultrasound guided,,,,,,,,  Narrative:  Start time: 06/10/2016 7:11 AM End time: 06/10/2016 7:18 AM Injection made incrementally with aspirations every 5 mL.  Performed by: Personally  Anesthesiologist: Shemika Robbs  Additional Notes: Functioning IV was confirmed and monitors were applied.  A Stimuplex needle was used. Sterile prep and drape,hand hygiene and sterile gloves were used.  Negative aspiration and negative test dose prior to incremental administration of local anesthetic. The patient tolerated the procedure well.

## 2016-06-10 NOTE — Anesthesia Post-op Follow-up Note (Cosign Needed)
Anesthesia QCDR form completed.        

## 2016-06-10 NOTE — Transfer of Care (Signed)
Immediate Anesthesia Transfer of Care Note  Patient: Crystal Bailey  Procedure(s) Performed: Procedure(s): HALLUX VALGUS AUSTIN (Left) HALLUX VALGUS AKIN-PHALANX OSTEOTOMY (Left)  Patient Location: PACU  Anesthesia Type:General  Level of Consciousness: awake and alert   Airway & Oxygen Therapy: Patient Spontanous Breathing and Patient connected to face mask oxygen  Post-op Assessment: Report given to RN and Post -op Vital signs reviewed and stable  Post vital signs: Reviewed and stable  Last Vitals:  Vitals:   06/10/16 0717 06/10/16 0722  BP: 124/73 115/68  Pulse: 71 68  Resp: 18 15  Temp:      Last Pain:  Vitals:   06/10/16 0623  TempSrc: Tympanic         Complications: No apparent anesthesia complications

## 2016-06-10 NOTE — OR Nursing (Signed)
Pt/daughter advised partial (50% wt only) when up and to wear postop shoe when walking.  Daughter advised she will get pt a walker.  Dr. Randa Lynn in to see patient in postop.

## 2016-06-10 NOTE — Anesthesia Postprocedure Evaluation (Signed)
Anesthesia Post Note  Patient: ASHLIEGH PAREKH  Procedure(s) Performed: Procedure(s) (LRB): HALLUX VALGUS AUSTIN (Left) HALLUX VALGUS AKIN-PHALANX OSTEOTOMY (Left)  Patient location during evaluation: PACU Anesthesia Type: General Level of consciousness: awake and alert and oriented Pain management: pain level controlled Vital Signs Assessment: post-procedure vital signs reviewed and stable Respiratory status: spontaneous breathing, nonlabored ventilation and respiratory function stable Cardiovascular status: blood pressure returned to baseline and stable Postop Assessment: no signs of nausea or vomiting Anesthetic complications: no     Last Vitals:  Vitals:   06/10/16 0945 06/10/16 1002  BP: (!) 145/70 (!) 136/59  Pulse: (!) 50 (!) 51  Resp: 16 16  Temp: 36.4 C     Last Pain:  Vitals:   06/10/16 0945  TempSrc: Temporal  PainSc:                  Sinda Leedom

## 2016-06-12 ENCOUNTER — Encounter: Payer: Self-pay | Admitting: Podiatry

## 2016-07-07 ENCOUNTER — Other Ambulatory Visit: Payer: Self-pay | Admitting: Internal Medicine

## 2016-07-07 DIAGNOSIS — Z1231 Encounter for screening mammogram for malignant neoplasm of breast: Secondary | ICD-10-CM

## 2016-08-10 ENCOUNTER — Ambulatory Visit
Admission: RE | Admit: 2016-08-10 | Discharge: 2016-08-10 | Disposition: A | Discharge status: Home / Self Care | Payer: Medicare HMO | Source: Ambulatory Visit | Attending: Internal Medicine | Admitting: Internal Medicine

## 2016-08-10 DIAGNOSIS — Z1231 Encounter for screening mammogram for malignant neoplasm of breast: Secondary | ICD-10-CM | POA: Insufficient documentation

## 2017-01-27 DIAGNOSIS — D649 Anemia, unspecified: Secondary | ICD-10-CM | POA: Diagnosis not present

## 2017-01-27 DIAGNOSIS — I10 Essential (primary) hypertension: Secondary | ICD-10-CM | POA: Diagnosis not present

## 2017-02-02 DIAGNOSIS — M19041 Primary osteoarthritis, right hand: Secondary | ICD-10-CM | POA: Diagnosis not present

## 2017-02-02 DIAGNOSIS — I10 Essential (primary) hypertension: Secondary | ICD-10-CM | POA: Diagnosis not present

## 2017-02-02 DIAGNOSIS — Z5181 Encounter for therapeutic drug level monitoring: Secondary | ICD-10-CM | POA: Diagnosis not present

## 2017-02-02 DIAGNOSIS — J4520 Mild intermittent asthma, uncomplicated: Secondary | ICD-10-CM | POA: Diagnosis not present

## 2017-03-06 DIAGNOSIS — F411 Generalized anxiety disorder: Secondary | ICD-10-CM | POA: Diagnosis not present

## 2017-04-07 ENCOUNTER — Encounter: Payer: Self-pay | Admitting: Emergency Medicine

## 2017-04-07 ENCOUNTER — Emergency Department
Admission: EM | Admit: 2017-04-07 | Discharge: 2017-04-07 | Disposition: A | Discharge status: Home / Self Care | Payer: Medicare HMO | Attending: Emergency Medicine | Admitting: Emergency Medicine

## 2017-04-07 DIAGNOSIS — R202 Paresthesia of skin: Secondary | ICD-10-CM | POA: Diagnosis not present

## 2017-04-07 DIAGNOSIS — Z79899 Other long term (current) drug therapy: Secondary | ICD-10-CM | POA: Diagnosis not present

## 2017-04-07 DIAGNOSIS — F17210 Nicotine dependence, cigarettes, uncomplicated: Secondary | ICD-10-CM | POA: Insufficient documentation

## 2017-04-07 DIAGNOSIS — M79601 Pain in right arm: Secondary | ICD-10-CM | POA: Diagnosis present

## 2017-04-07 DIAGNOSIS — I10 Essential (primary) hypertension: Secondary | ICD-10-CM | POA: Diagnosis not present

## 2017-04-07 DIAGNOSIS — J45909 Unspecified asthma, uncomplicated: Secondary | ICD-10-CM | POA: Insufficient documentation

## 2017-04-07 LAB — CBC
HCT: 36.5 % (ref 35.0–47.0)
HEMOGLOBIN: 11.6 g/dL — AB (ref 12.0–16.0)
MCH: 24.7 pg — AB (ref 26.0–34.0)
MCHC: 31.8 g/dL — ABNORMAL LOW (ref 32.0–36.0)
MCV: 77.7 fL — ABNORMAL LOW (ref 80.0–100.0)
Platelets: 242 10*3/uL (ref 150–440)
RBC: 4.69 MIL/uL (ref 3.80–5.20)
RDW: 14.5 % (ref 11.5–14.5)
WBC: 4.9 10*3/uL (ref 3.6–11.0)

## 2017-04-07 LAB — BASIC METABOLIC PANEL
ANION GAP: 9 (ref 5–15)
BUN: 15 mg/dL (ref 6–20)
CALCIUM: 10.3 mg/dL (ref 8.9–10.3)
CO2: 28 mmol/L (ref 22–32)
Chloride: 98 mmol/L — ABNORMAL LOW (ref 101–111)
Creatinine, Ser: 0.9 mg/dL (ref 0.44–1.00)
GFR calc non Af Amer: >60 mL/min (ref >60)
Glucose, Bld: 106 mg/dL — ABNORMAL HIGH (ref 65–99)
Potassium: 3.8 mmol/L (ref 3.5–5.1)
Sodium: 135 mmol/L (ref 135–145)

## 2017-04-07 LAB — TROPONIN I

## 2017-04-07 NOTE — Discharge Instructions (Signed)
Your lab tests and ekg today were unremarkable. Continue following up with your doctors to monitor your symptoms.

## 2017-04-07 NOTE — ED Triage Notes (Signed)
Patient presents to ED via POV from home with c/o left arm numbness that radiates into her chest. Patient reports "I just feel funny when it happens then I smell this foul odor". Patient ambulatory to triage. Even and non labored respirations noted.

## 2017-04-07 NOTE — ED Notes (Signed)
Pt given drink and crackers  

## 2017-04-07 NOTE — ED Triage Notes (Signed)
First Nurse Note: pt c/o that her right arm gets cool and then as the arm warms up her chest gets cool and then she smells a funny smell. This comes and goes several times per day. Does not stop her from doing ADLs. PCP told her she has arthritis.

## 2017-04-07 NOTE — ED Notes (Signed)
Pt up to toilet 

## 2017-04-07 NOTE — ED Provider Notes (Signed)
[facility] Emergency Department Provider Note  ____________________________________________  Time seen: Approximately 5:31 PM  I have reviewed the triage vital signs and the nursing notes.   HISTORY  Chief Complaint Arm Pain    HPI Crystal Bailey is a 67 y.o. female who complains of paresthesias of her right arm described as altered temperature sensation that starts in her hand and works its way up the arm into her shoulder. This is happening several times per day for the past few weeks. Denies any weakness vision changes or headaches. No neck pain or stiffness. No fevers chills or vomiting. No vision changes. No recent trauma. No aggravating or alleviating factors. It's intermittent, lasts for just a minute or 2 and then resolve spontaneously.  not exertional. Denies chest pain or shortness of breath.   Past Medical History:  Diagnosis Date  . Anemia   . Anxiety   . Arthritis   . Asthma   . Cancer [facility]) 2011   kidney  . GERD (gastroesophageal reflux disease)   . Hip pain    Lt hip  . Hypertension   . Lack of bladder control      There are no active problems to display for this patient.    Past Surgical History:  Procedure Laterality Date  . ABDOMINAL HYSTERECTOMY    . bilateral M&T Bilateral   . BUNIONECTOMY Right   . CARPAL TUNNEL RELEASE    . COLONOSCOPY WITH PROPOFOL N/A 09/18/2014   Procedure: COLONOSCOPY WITH PROPOFOL;  Surgeon: Hulen Luster, MD;  Location: [facility] ENDOSCOPY;  Service: Gastroenterology;  Laterality: N/A;  . HALLUX VALGUS AKIN Left 06/10/2016   Procedure: HALLUX VALGUS AKIN-PHALANX OSTEOTOMY;  Surgeon: Samara Deist, DPM;  Location: [facility] ORS;  Service: Podiatry;  Laterality: Left;  . HALLUX VALGUS AUSTIN Left 06/10/2016   Procedure: HALLUX VALGUS AUSTIN;  Surgeon: Samara Deist, DPM;  Location: [facility] ORS;  Service: Podiatry;  Laterality: Left;  . KIDNEY STONE SURGERY    . KIDNEY SURGERY       Prior to Admission  medications   Medication Sig Start Date End Date Taking? Authorizing Provider  acetaminophen (TYLENOL) 500 MG tablet Take 500 mg by mouth every 6 (six) hours as needed for moderate pain or headache.    Yes [provider]  albuterol (PROVENTIL HFA;VENTOLIN HFA) 108 (90 BASE) MCG/ACT inhaler Inhale 1-2 puffs into the lungs every 6 (six) hours as needed for wheezing or shortness of breath.    Yes [provider]  cetirizine (ZYRTEC) 10 MG tablet Take 10 mg by mouth daily.   Yes [provider]  cholecalciferol (VITAMIN D) 1000 units tablet Take 1,000 Units by mouth daily.   Yes [provider]  diclofenac sodium (VOLTAREN) 1 % GEL Apply 2 g topically 4 (four) times daily.   Yes [provider]  ferrous sulfate 325 (65 FE) MG EC tablet Take 325 mg by mouth daily.   Yes [provider]  Fluticasone-Salmeterol (ADVAIR) 250-50 MCG/DOSE AEPB Inhale 1 puff into the lungs every 12 (twelve) hours.    Yes [provider]  hydrochlorothiazide (HYDRODIURIL) 25 MG tablet Take 25 mg by mouth daily.  04/29/16  Yes [provider]  omeprazole (PRILOSEC OTC) 20 MG tablet Take 20 mg by mouth 3 (three) times daily as needed (acid reflux).   Yes [provider]  oxybutynin (DITROPAN) 5 MG tablet Take 5 mg by mouth 2 (two) times daily.    Yes [provider]  traMADol (  ULTRAM) 50 MG tablet Take 50-100 mg by mouth daily as needed for moderate pain.    Yes [provider]     Allergies Patient has no known allergies.   Family History  Problem Relation Age of Onset  . Breast cancer Mother 44  . Breast cancer Sister 24    Social History Social History   Tobacco Use  . Smoking status: Current Some Day Smoker    Packs/day: 0.25    Types: Cigarettes  . Smokeless tobacco: Current User    Types: Snuff  Substance Use Topics  . Alcohol use: No  . Drug use: No    Review of Systems  Constitutional:   No fever or  chills.   Cardiovascular:   No chest pain or syncope. Respiratory:   No dyspnea or cough. Gastrointestinal:   Negative for abdominal pain, vomiting and diarrhea.  Musculoskeletal:   Negative for focal pain or swelling All other systems reviewed and are negative except as documented above in ROS and HPI.  ____________________________________________   PHYSICAL EXAM:  VITAL SIGNS: ED Triage Vitals [04/07/17 1200]  Enc Vitals Group     BP (!) 151/72     Pulse Rate (!) 52     Resp 16     Temp 98.1 F (36.7 C)     Temp Source Oral     SpO2 99 %     Weight 120 lb (54.4 kg)     Height 5' (1.524 m)     Head Circumference      Peak Flow      Pain Score      Pain Loc      Pain Edu?      Excl. in [place]?     Vital signs reviewed, nursing assessments reviewed.   Constitutional:   Alert and oriented. Well appearing and in no distress. Eyes:   No scleral icterus.  EOMI. No nystagmus. No conjunctival pallor. PERRL. ENT   Head:   Normocephalic and atraumatic.   Nose:   No congestion/rhinnorhea.    Mouth/Throat:   MMM, no pharyngeal erythema. No peritonsillar mass.    Neck:   No meningismus. Full ROM. Hematological/Lymphatic/Immunilogical:   No cervical lymphadenopathy. Cardiovascular:   bradycardia rate of 50. Symmetric bilateral radial and DP pulses.  No murmurs.  Respiratory:   Normal respiratory effort without tachypnea/retractions. Breath sounds are clear and equal bilaterally. No wheezes/rales/rhonchi. Gastrointestinal:   Soft and nontender. Non distended. There is no CVA tenderness.  No rebound, rigidity, or guarding. Genitourinary:   deferred Musculoskeletal:   Normal range of motion in all extremities. No joint effusions.  No lower extremity tenderness.  No edema. Neurologic:   Normal speech and language.  Motor grossly intact. cerebellar function intact Steady gait Cranial nerves III through XII intact No acute focal neurologic deficits are appreciated.  Skin:     Skin is warm, dry and intact. No rash noted.  No petechiae, purpura, or bullae.  ____________________________________________    LABS (pertinent positives/negatives) (all labs ordered are listed, but only abnormal results are displayed) Labs Reviewed  BASIC METABOLIC PANEL - Abnormal; Notable for the following components:      Result Value   Chloride 98 (*)    Glucose, Bld 106 (*)    All other components within normal limits  CBC - Abnormal; Notable for the following components:   Hemoglobin 11.6 (*)    MCV 77.7 (*)    MCH 24.7 (*)    MCHC 31.8 (*)  All other components within normal limits  TROPONIN I   ____________________________________________   EKG  interpreted by me Sinus bradycardia rate of 51, normal axis intervals gross ST segments and T waves. No acute ischemic changes  ____________________________________________    RADIOLOGY  No results found.  ____________________________________________   PROCEDURES Procedures  ____________________________________________    CLINICAL IMPRESSION / ASSESSMENT AND PLAN / ED COURSE  Pertinent labs & imaging results that were available during my care of the patient were reviewed by me and considered in my medical decision making (see chart for details).   patient well-appearing no acute distress, currently asymptomatic, vital signs unremarkable. Presents with a episodic paresthesia of the right arm. She has seen her doctor for this who treated it to arthritis is chronic for her. No evidence of stroke or nerve root impingement. Doubt ACS PE or dissection. Patient's well-appearing, ambulatory with steady gait, neurologically intact. Suitable for outpatient follow-up with primary care and neurology. She reports she has not had any of this symptom since noon today and feels better and wants to go home which I think is reasonable.      ____________________________________________   FINAL CLINICAL IMPRESSION(S) /  ED DIAGNOSES    Final diagnoses:  Paresthesia of right arm     ED Discharge Orders    None      Portions of this note were generated with dragon dictation software. Dictation errors may occur despite best attempts at proofreading.    Carrie Mew, MD 04/07/17 1734

## 2017-04-11 DIAGNOSIS — R202 Paresthesia of skin: Secondary | ICD-10-CM | POA: Diagnosis not present

## 2017-04-11 DIAGNOSIS — M4802 Spinal stenosis, cervical region: Secondary | ICD-10-CM | POA: Diagnosis not present

## 2017-04-11 DIAGNOSIS — I10 Essential (primary) hypertension: Secondary | ICD-10-CM | POA: Diagnosis not present

## 2017-04-11 DIAGNOSIS — R51 Headache: Secondary | ICD-10-CM | POA: Diagnosis not present

## 2017-05-31 DIAGNOSIS — R531 Weakness: Secondary | ICD-10-CM | POA: Diagnosis not present

## 2017-05-31 DIAGNOSIS — M19041 Primary osteoarthritis, right hand: Secondary | ICD-10-CM | POA: Diagnosis not present

## 2017-05-31 DIAGNOSIS — D649 Anemia, unspecified: Secondary | ICD-10-CM | POA: Diagnosis not present

## 2017-05-31 DIAGNOSIS — R202 Paresthesia of skin: Secondary | ICD-10-CM | POA: Diagnosis not present

## 2017-06-06 DIAGNOSIS — D649 Anemia, unspecified: Secondary | ICD-10-CM | POA: Diagnosis not present

## 2017-06-06 DIAGNOSIS — R195 Other fecal abnormalities: Secondary | ICD-10-CM | POA: Diagnosis not present

## 2017-06-06 DIAGNOSIS — Z1211 Encounter for screening for malignant neoplasm of colon: Secondary | ICD-10-CM | POA: Diagnosis not present

## 2017-07-26 DIAGNOSIS — Z5181 Encounter for therapeutic drug level monitoring: Secondary | ICD-10-CM | POA: Diagnosis not present

## 2017-08-03 DIAGNOSIS — Z0001 Encounter for general adult medical examination with abnormal findings: Secondary | ICD-10-CM | POA: Diagnosis not present

## 2017-08-03 DIAGNOSIS — Z0000 Encounter for general adult medical examination without abnormal findings: Secondary | ICD-10-CM | POA: Diagnosis not present

## 2017-08-03 DIAGNOSIS — J4520 Mild intermittent asthma, uncomplicated: Secondary | ICD-10-CM | POA: Diagnosis not present

## 2017-08-03 DIAGNOSIS — D649 Anemia, unspecified: Secondary | ICD-10-CM | POA: Diagnosis not present

## 2017-08-03 DIAGNOSIS — I10 Essential (primary) hypertension: Secondary | ICD-10-CM | POA: Diagnosis not present

## 2017-08-03 DIAGNOSIS — H60501 Unspecified acute noninfective otitis externa, right ear: Secondary | ICD-10-CM | POA: Diagnosis not present

## 2017-08-04 ENCOUNTER — Other Ambulatory Visit: Payer: Self-pay | Admitting: Internal Medicine

## 2017-08-04 DIAGNOSIS — Z1231 Encounter for screening mammogram for malignant neoplasm of breast: Secondary | ICD-10-CM

## 2017-09-05 ENCOUNTER — Ambulatory Visit
Admission: RE | Admit: 2017-09-05 | Discharge: 2017-09-05 | Disposition: A | Discharge status: Home / Self Care | Payer: Medicare HMO | Source: Ambulatory Visit | Attending: Internal Medicine | Admitting: Internal Medicine

## 2017-09-05 DIAGNOSIS — Z1231 Encounter for screening mammogram for malignant neoplasm of breast: Secondary | ICD-10-CM | POA: Diagnosis not present

## 2018-01-30 DIAGNOSIS — I10 Essential (primary) hypertension: Secondary | ICD-10-CM | POA: Diagnosis not present

## 2018-02-06 DIAGNOSIS — J4520 Mild intermittent asthma, uncomplicated: Secondary | ICD-10-CM | POA: Diagnosis not present

## 2018-02-06 DIAGNOSIS — R634 Abnormal weight loss: Secondary | ICD-10-CM | POA: Diagnosis not present

## 2018-02-06 DIAGNOSIS — M545 Low back pain: Secondary | ICD-10-CM | POA: Diagnosis not present

## 2018-02-06 DIAGNOSIS — G8929 Other chronic pain: Secondary | ICD-10-CM | POA: Diagnosis not present

## 2018-02-06 DIAGNOSIS — M19041 Primary osteoarthritis, right hand: Secondary | ICD-10-CM | POA: Diagnosis not present

## 2018-02-06 DIAGNOSIS — I10 Essential (primary) hypertension: Secondary | ICD-10-CM | POA: Diagnosis not present

## 2018-05-28 DIAGNOSIS — M542 Cervicalgia: Secondary | ICD-10-CM | POA: Diagnosis not present

## 2018-05-29 DIAGNOSIS — M542 Cervicalgia: Secondary | ICD-10-CM | POA: Diagnosis not present

## 2018-08-06 ENCOUNTER — Other Ambulatory Visit: Payer: Self-pay | Admitting: Internal Medicine

## 2018-08-13 ENCOUNTER — Other Ambulatory Visit: Payer: Self-pay | Admitting: Internal Medicine

## 2018-08-13 DIAGNOSIS — Z1231 Encounter for screening mammogram for malignant neoplasm of breast: Secondary | ICD-10-CM

## 2018-09-17 ENCOUNTER — Ambulatory Visit
Admission: RE | Admit: 2018-09-17 | Discharge: 2018-09-17 | Disposition: A | Discharge status: Home / Self Care | Payer: Medicare Other | Source: Ambulatory Visit | Attending: Internal Medicine | Admitting: Internal Medicine

## 2018-09-17 DIAGNOSIS — Z1231 Encounter for screening mammogram for malignant neoplasm of breast: Secondary | ICD-10-CM | POA: Diagnosis not present

## 2019-08-05 DIAGNOSIS — I10 Essential (primary) hypertension: Secondary | ICD-10-CM | POA: Diagnosis not present

## 2019-08-08 ENCOUNTER — Other Ambulatory Visit: Payer: Self-pay | Admitting: Internal Medicine

## 2019-08-08 DIAGNOSIS — Z1231 Encounter for screening mammogram for malignant neoplasm of breast: Secondary | ICD-10-CM

## 2019-08-12 DIAGNOSIS — Z0001 Encounter for general adult medical examination with abnormal findings: Secondary | ICD-10-CM | POA: Diagnosis not present

## 2019-08-12 DIAGNOSIS — J4520 Mild intermittent asthma, uncomplicated: Secondary | ICD-10-CM | POA: Diagnosis not present

## 2019-08-12 DIAGNOSIS — I10 Essential (primary) hypertension: Secondary | ICD-10-CM | POA: Diagnosis not present

## 2019-09-18 ENCOUNTER — Ambulatory Visit
Admission: RE | Admit: 2019-09-18 | Discharge: 2019-09-18 | Disposition: A | Discharge status: Home / Self Care | Payer: Medicare Other | Source: Ambulatory Visit | Attending: Internal Medicine | Admitting: Internal Medicine

## 2019-09-18 ENCOUNTER — Other Ambulatory Visit: Payer: Self-pay

## 2019-09-18 DIAGNOSIS — Z1231 Encounter for screening mammogram for malignant neoplasm of breast: Secondary | ICD-10-CM | POA: Diagnosis not present

## 2020-02-07 DIAGNOSIS — I10 Essential (primary) hypertension: Secondary | ICD-10-CM | POA: Diagnosis not present

## 2020-02-12 DIAGNOSIS — Z0000 Encounter for general adult medical examination without abnormal findings: Secondary | ICD-10-CM | POA: Diagnosis not present

## 2020-02-12 DIAGNOSIS — Z85528 Personal history of other malignant neoplasm of kidney: Secondary | ICD-10-CM | POA: Diagnosis not present

## 2020-02-12 DIAGNOSIS — I10 Essential (primary) hypertension: Secondary | ICD-10-CM | POA: Diagnosis not present

## 2020-02-12 DIAGNOSIS — D509 Iron deficiency anemia, unspecified: Secondary | ICD-10-CM | POA: Diagnosis not present

## 2020-02-12 DIAGNOSIS — J4520 Mild intermittent asthma, uncomplicated: Secondary | ICD-10-CM | POA: Diagnosis not present

## 2020-02-12 DIAGNOSIS — G8929 Other chronic pain: Secondary | ICD-10-CM | POA: Diagnosis not present

## 2020-02-12 DIAGNOSIS — M545 Low back pain, unspecified: Secondary | ICD-10-CM | POA: Diagnosis not present

## 2020-02-12 DIAGNOSIS — R6889 Other general symptoms and signs: Secondary | ICD-10-CM | POA: Diagnosis not present

## 2020-08-11 ENCOUNTER — Other Ambulatory Visit: Payer: Self-pay | Admitting: Internal Medicine

## 2020-08-11 DIAGNOSIS — Z1231 Encounter for screening mammogram for malignant neoplasm of breast: Secondary | ICD-10-CM

## 2020-09-18 ENCOUNTER — Ambulatory Visit
Admission: RE | Admit: 2020-09-18 | Discharge: 2020-09-18 | Disposition: A | Discharge status: Home / Self Care | Payer: Medicare Other | Source: Ambulatory Visit | Attending: Internal Medicine | Admitting: Internal Medicine

## 2020-09-18 ENCOUNTER — Other Ambulatory Visit: Payer: Self-pay

## 2020-09-18 DIAGNOSIS — Z1231 Encounter for screening mammogram for malignant neoplasm of breast: Secondary | ICD-10-CM | POA: Insufficient documentation

## 2020-09-26 IMAGING — MG DIGITAL SCREENING BILATERAL MAMMOGRAM WITH TOMO AND CAD
6 of 10 series · 6 of 30 positions shown · non-contrast
Comparison: Previous exam(s).

CLINICAL DATA: Screening.

EXAM:
DIGITAL SCREENING BILATERAL MAMMOGRAM WITH TOMO AND CAD

[L CC synth-2D]
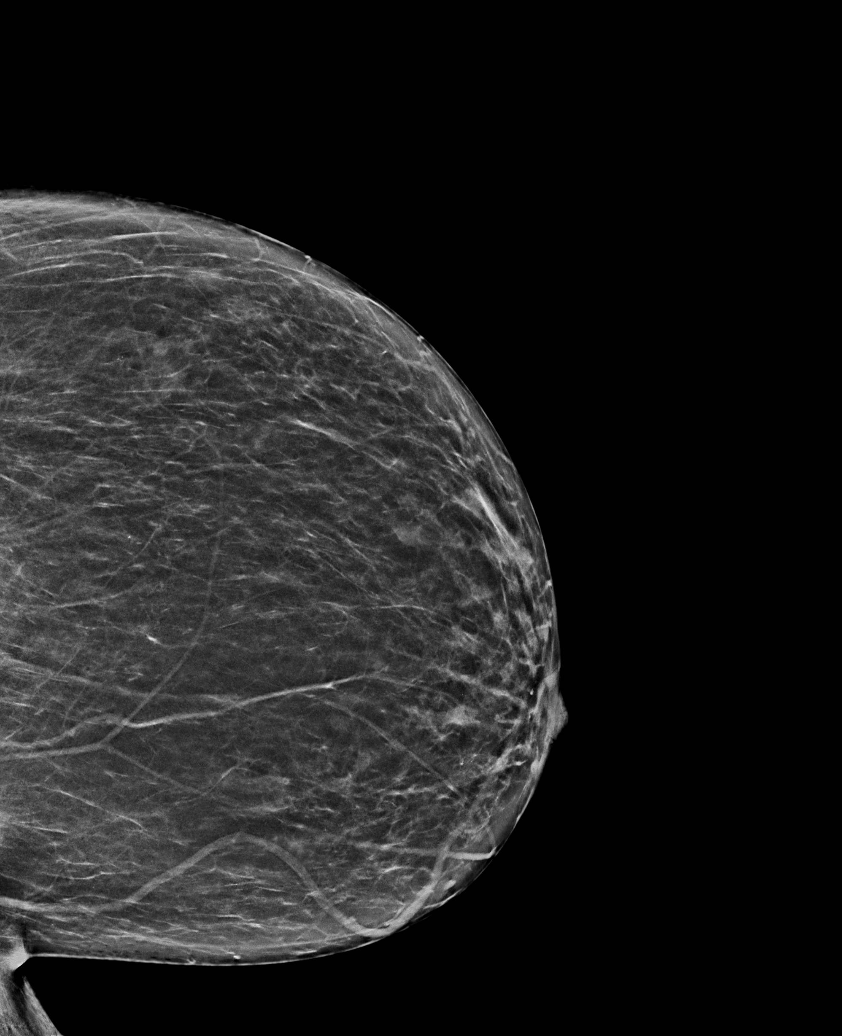

[L MLO synth-2D]
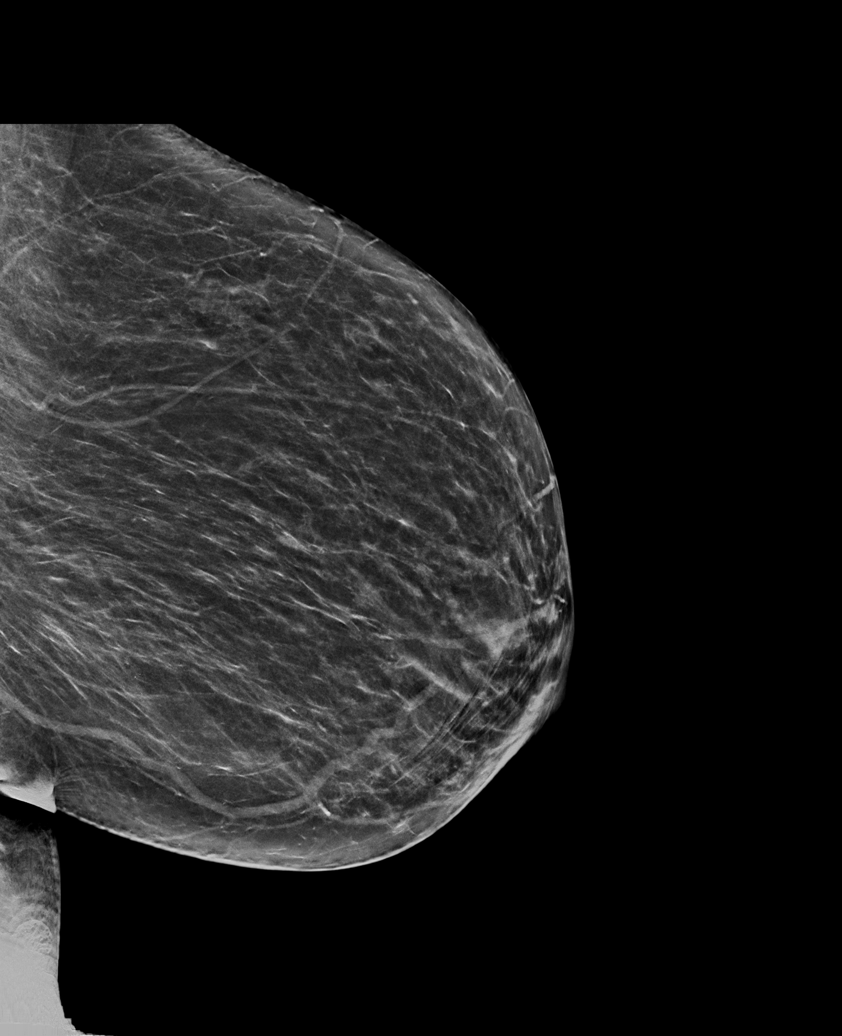

[R MLO synth-2D (1 of 2)]
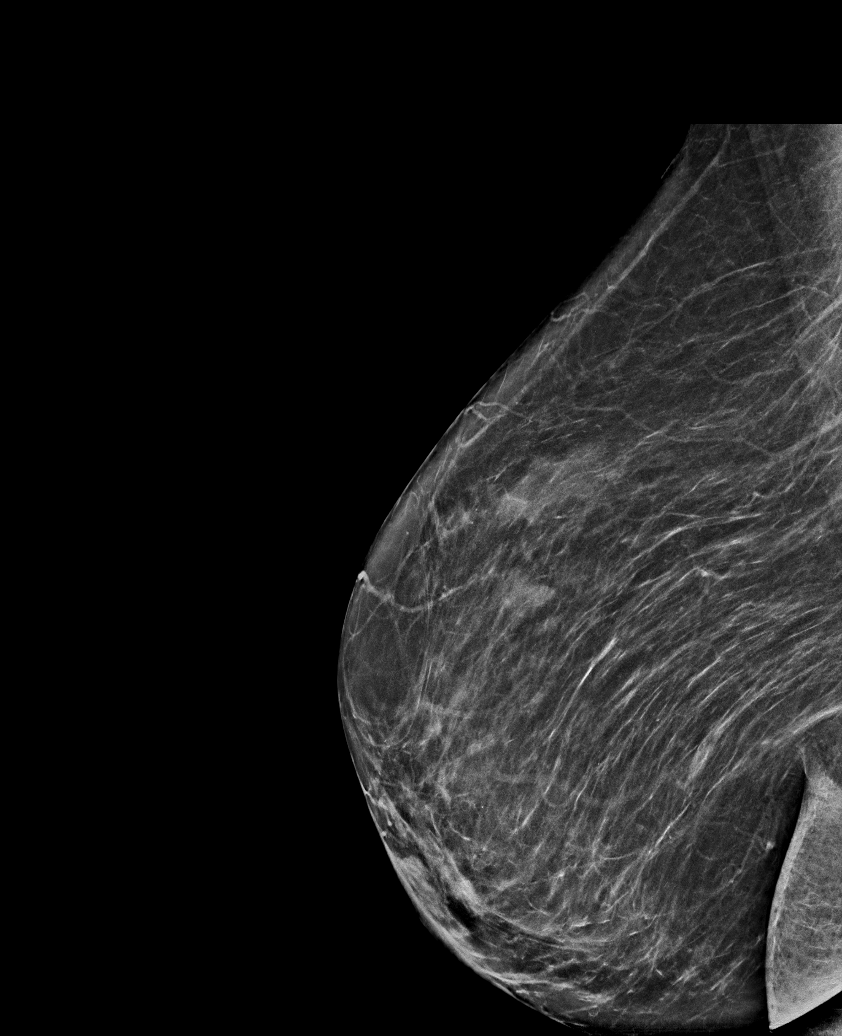

[R CC synth-2D]
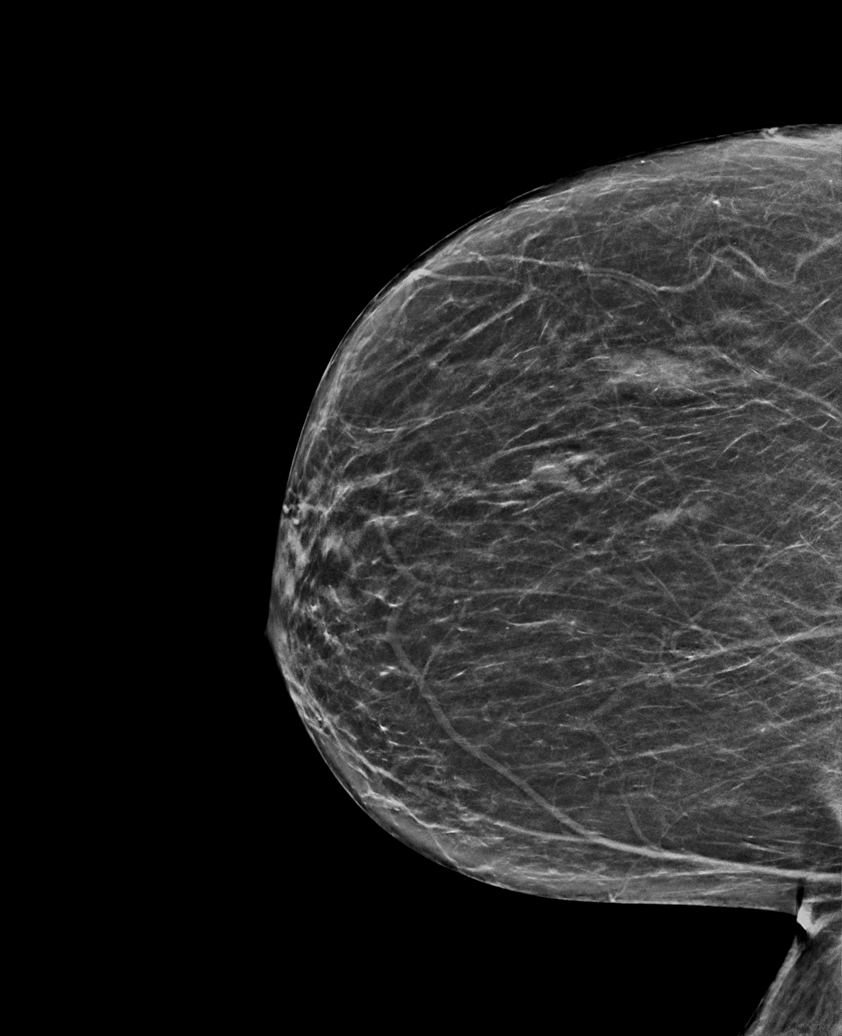

[R MLO synth-2D (2 of 2)]
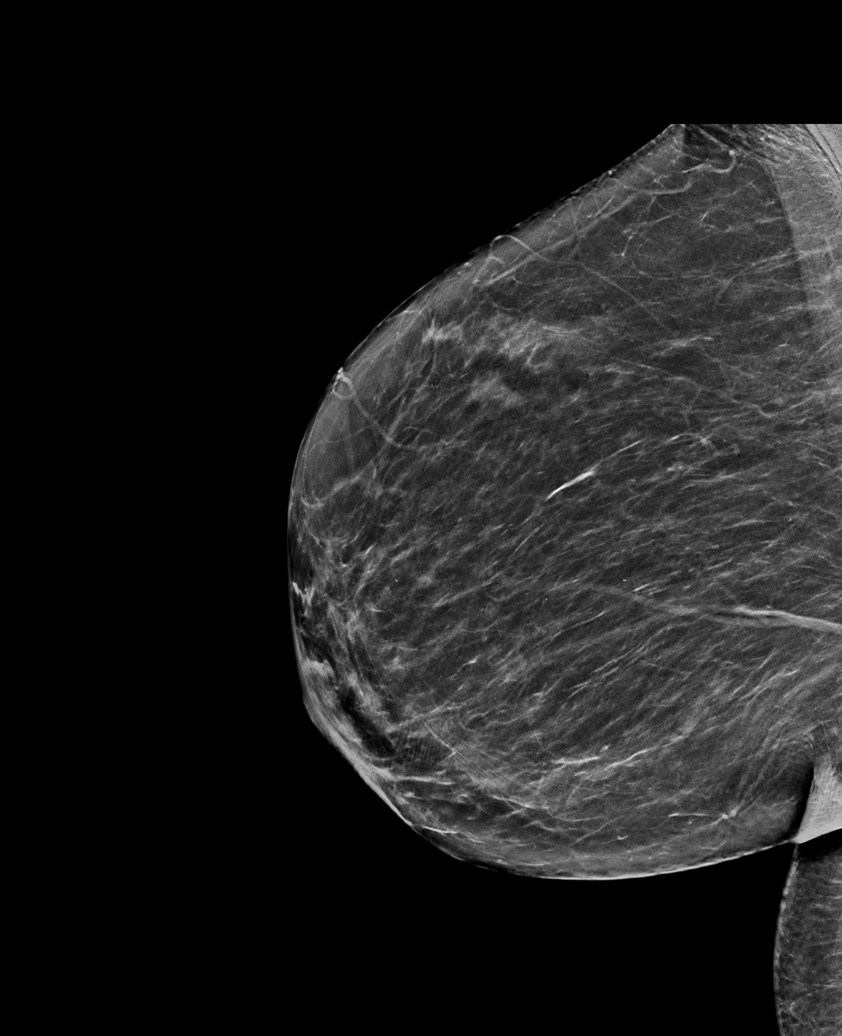

[L MLO tomo · tomo slice 33/66.0]
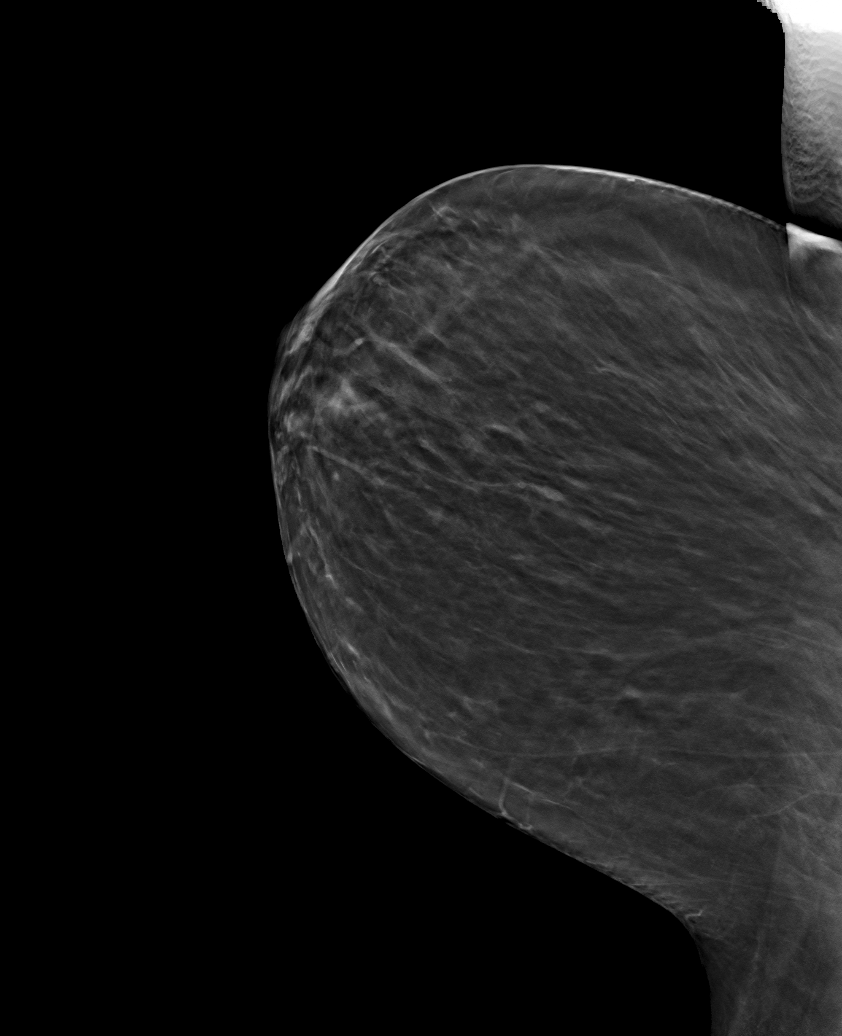

[6 of 30 positions shown; findings below may reference images not displayed]

ACR Breast Density Category b: There are scattered areas of
fibroglandular density.
FINDINGS: There are no findings suspicious for malignancy. Images were
processed with CAD.
IMPRESSION: No mammographic evidence of malignancy. A result letter of this
screening mammogram will be mailed directly to the patient.

RECOMMENDATION:
Screening mammogram in one year. (Code:[id number])

BI-RADS CATEGORY  1: Negative.

## 2020-11-29 ENCOUNTER — Emergency Department
Admission: EM | Admit: 2020-11-29 | Discharge: 2020-11-29 | Disposition: A | Discharge status: Home / Self Care | Payer: Medicare Other | Attending: Emergency Medicine | Admitting: Emergency Medicine

## 2020-11-29 ENCOUNTER — Other Ambulatory Visit: Payer: Self-pay

## 2020-11-29 DIAGNOSIS — I10 Essential (primary) hypertension: Secondary | ICD-10-CM | POA: Insufficient documentation

## 2020-11-29 DIAGNOSIS — B029 Zoster without complications: Secondary | ICD-10-CM | POA: Diagnosis not present

## 2020-11-29 DIAGNOSIS — F17210 Nicotine dependence, cigarettes, uncomplicated: Secondary | ICD-10-CM | POA: Diagnosis not present

## 2020-11-29 DIAGNOSIS — J45909 Unspecified asthma, uncomplicated: Secondary | ICD-10-CM | POA: Diagnosis not present

## 2020-11-29 DIAGNOSIS — Z85828 Personal history of other malignant neoplasm of skin: Secondary | ICD-10-CM | POA: Insufficient documentation

## 2020-11-29 DIAGNOSIS — R21 Rash and other nonspecific skin eruption: Secondary | ICD-10-CM | POA: Diagnosis present

## 2020-11-29 DIAGNOSIS — Z79899 Other long term (current) drug therapy: Secondary | ICD-10-CM | POA: Diagnosis not present

## 2020-11-29 MED ORDER — FAMCICLOVIR 500 MG PO TABS: 500.0000 mg | ORAL_TABLET | Freq: Three times a day (TID) | ORAL | 0 refills | Status: DC

## 2020-11-29 MED ORDER — OXYCODONE-ACETAMINOPHEN 5-325 MG PO TABS: 1.0000 | ORAL_TABLET | ORAL | 0 refills | Status: AC | PRN

## 2020-11-29 MED ORDER — METHYLPREDNISOLONE 4 MG PO TBPK: ORAL_TABLET | ORAL | 0 refills | Status: DC

## 2020-11-29 NOTE — ED Provider Notes (Signed)
[facility] Emergency Department Provider Note  ____________________________________________   Event Date/Time   First MD Initiated Contact with Patient 11/29/20 1000     (approximate)  I have reviewed the triage vital signs and the nursing notes.   HISTORY  Chief Complaint Herpes Zoster    HPI Crystal Bailey is a 70 y.o. female to the emergency department with a rash to the left shoulder that is painful.  Symptoms for 3 to 4 days.  She has been putting alcohol and heat on it.  States is gotten worse.  She denies fever or chills.  No chest pain shortness of breath.  Past Medical History:  Diagnosis Date   Anemia    Anxiety    Arthritis    Asthma    Cancer ([place]) 2011   kidney   GERD (gastroesophageal reflux disease)    Hip pain    Lt hip   Hypertension    Lack of bladder control     There are no problems to display for this patient.   Past Surgical History:  Procedure Laterality Date   ABDOMINAL HYSTERECTOMY     bilateral M&T Bilateral    BUNIONECTOMY Right    CARPAL TUNNEL RELEASE     COLONOSCOPY WITH PROPOFOL N/A 09/18/2014   Procedure: COLONOSCOPY WITH PROPOFOL;  Surgeon: Hulen Luster, MD;  Location: [facility] ENDOSCOPY;  Service: Gastroenterology;  Laterality: N/A;   HALLUX VALGUS AKIN Left 06/10/2016   Procedure: HALLUX VALGUS AKIN-PHALANX OSTEOTOMY;  Surgeon: Samara Deist, DPM;  Location: [facility] ORS;  Service: Podiatry;  Laterality: Left;   HALLUX VALGUS AUSTIN Left 06/10/2016   Procedure: HALLUX VALGUS AUSTIN;  Surgeon: Samara Deist, DPM;  Location: [facility] ORS;  Service: Podiatry;  Laterality: Left;   KIDNEY STONE SURGERY     KIDNEY SURGERY      Prior to Admission medications   Medication Sig Start Date End Date Taking? Authorizing Provider  acetaminophen (TYLENOL) 500 MG tablet Take 500 mg by mouth every 6 (six) hours as needed for moderate pain or headache.     [provider]  albuterol (PROVENTIL HFA;VENTOLIN HFA) 108 (90  BASE) MCG/ACT inhaler Inhale 1-2 puffs into the lungs every 6 (six) hours as needed for wheezing or shortness of breath.     [provider]  cetirizine (ZYRTEC) 10 MG tablet Take 10 mg by mouth daily.    [provider]  cholecalciferol (VITAMIN D) 1000 units tablet Take 1,000 Units by mouth daily.    [provider]  diclofenac sodium (VOLTAREN) 1 % GEL Apply 2 g topically 4 (four) times daily.    [provider]  famciclovir (FAMVIR) 500 MG tablet Take 1 tablet (500 mg total) by mouth 3 (three) times daily. 11/29/20  Yes Versie Starks, PA-C  ferrous sulfate 325 (65 FE) MG EC tablet Take 325 mg by mouth daily.    [provider]  Fluticasone-Salmeterol (ADVAIR) 250-50 MCG/DOSE AEPB Inhale 1 puff into the lungs every 12 (twelve) hours.     [provider]  hydrochlorothiazide (HYDRODIURIL) 25 MG tablet Take 25 mg by mouth daily.  04/29/16   [provider]  methylPREDNISolone (MEDROL DOSEPAK) 4 MG TBPK tablet Take 6 pills on day one then decrease by 1 pill each day 11/29/20  Yes Crystal Bailey, Crystal Dolin, PA-C  omeprazole (PRILOSEC OTC) 20 MG tablet Take 20 mg by mouth 3 (three) times daily as needed (acid reflux).    [provider]  oxybutynin (DITROPAN) 5  MG tablet Take 5 mg by mouth 2 (two) times daily.     [provider]  oxyCODONE-acetaminophen (PERCOCET) 5-325 MG tablet Take 1 tablet by mouth every 4 (four) hours as needed for severe pain. 11/29/20 11/29/21 Yes Lynisha Osuch, Crystal Dolin, PA-C  traMADol (ULTRAM) 50 MG tablet Take 50-100 mg by mouth daily as needed for moderate pain.     [provider]    Allergies Patient has no known allergies.  Family History  Problem Relation Age of Onset   Breast cancer Mother 57   Breast cancer Sister 51    Social History Social History   Tobacco Use   Smoking status: Some Days    Packs/day: 0.25    Types: Cigarettes   Smokeless tobacco: Current    Types: Snuff  Substance  Use Topics   Alcohol use: No   Drug use: No    Review of Systems  Constitutional: No fever/chills Eyes: No visual changes. ENT: No sore throat. Respiratory: Denies cough Cardiovascular: Denies chest pain Gastrointestinal: Denies abdominal pain Genitourinary: Negative for dysuria. Musculoskeletal: Negative for back pain. Skin: Positive for rash. Psychiatric: no mood changes,     ____________________________________________   PHYSICAL EXAM:  VITAL SIGNS: ED Triage Vitals  Enc Vitals Group     BP 11/29/20 0950 120/63     Pulse Rate 11/29/20 0950 60     Resp 11/29/20 0950 20     Temp 11/29/20 0950 98.2 F (36.8 C)     Temp Source 11/29/20 0950 Oral     SpO2 11/29/20 0950 99 %     Weight 11/29/20 0950 101 lb (45.8 kg)     Height 11/29/20 0950 5' (1.524 m)     Head Circumference --      Peak Flow --      Pain Score 11/29/20 1001 8     Pain Loc --      Pain Edu? --      Excl. in [place]? --     Constitutional: Alert and oriented. Well appearing and in no acute distress. Eyes: Conjunctivae are normal.  Head: Atraumatic. Nose: No congestion/rhinnorhea. Mouth/Throat: Mucous membranes are moist.   Neck:  supple no lymphadenopathy noted Cardiovascular: Normal rate, regular rhythm. Heart sounds are normal Respiratory: Normal respiratory effort.  No retractions, lungs c t a  GU: deferred Musculoskeletal: FROM all extremities, warm and well perfused Neurologic:  Normal speech and language.  Skin:  Skin is warm, dry and intact.  Rash noted on the left shoulder that is erythematous base with a vesicle.  Typical of shingles Psychiatric: Mood and affect are normal. Speech and behavior are normal.  ____________________________________________   LABS (all labs ordered are listed, but only abnormal results are displayed)  Labs Reviewed - No data to  display ____________________________________________   ____________________________________________  RADIOLOGY    ____________________________________________   PROCEDURES  Procedure(s) performed: No  Procedures    ____________________________________________   INITIAL IMPRESSION / ASSESSMENT AND PLAN / ED COURSE  Pertinent labs & imaging results that were available during my care of the patient were reviewed by me and considered in my medical decision making (see chart for details).   Patient is a 70 year old female presents with rash to the left shoulder.  Area is painful.  See HPI.  Physical exam shows patient be stable.  Exam is consistent with shingles.  Patient was given a prescription for Famvir, Medrol Dosepak, and Percocet.  She does not take tramadol while she is taking Percocet for  pain.  She is to return to emergency department worsening.  Follow-up with your regular doctor if not improving.  Discharged in stable condition and in agreement treatment plan.     Crystal Bailey was evaluated in Emergency Department on 11/29/2020 for the symptoms described in the history of present illness. She was evaluated in the context of the global COVID-19 pandemic, which necessitated consideration that the patient might be at risk for infection with the SARS-CoV-2 virus that causes COVID-19. Institutional protocols and algorithms that pertain to the evaluation of patients at risk for COVID-19 are in a state of rapid change based on information released by regulatory bodies including the CDC and federal and state organizations. These policies and algorithms were followed during the patient's care in the ED.    As part of my medical decision making, I reviewed the following data within the [place] History obtained from family, Nursing notes reviewed and incorporated, Old chart reviewed, Notes from prior ED visits, and [place] Controlled Substance  Database  ____________________________________________   FINAL CLINICAL IMPRESSION(S) / ED DIAGNOSES  Final diagnoses:  Herpes zoster without complication      NEW MEDICATIONS STARTED DURING THIS VISIT:  New Prescriptions   FAMCICLOVIR (FAMVIR) 500 MG TABLET    Take 1 tablet (500 mg total) by mouth 3 (three) times daily.   METHYLPREDNISOLONE (MEDROL DOSEPAK) 4 MG TBPK TABLET    Take 6 pills on day one then decrease by 1 pill each day   OXYCODONE-ACETAMINOPHEN (PERCOCET) 5-325 MG TABLET    Take 1 tablet by mouth every 4 (four) hours as needed for severe pain.     Note:  This document was prepared using Dragon voice recognition software and may include unintentional dictation errors.    Versie Starks, PA-C 11/29/20 1007    Rada Hay, MD 11/29/20 [phone]

## 2020-11-29 NOTE — Discharge Instructions (Signed)
Do not take your tramadol while taking the Percocet. Take the Famvir Medrol Dosepak as prescribed Follow-up with your regular doctor if not improving in 2 to 3 days. Return emergency department worsening

## 2020-11-29 NOTE — ED Triage Notes (Signed)
Pt reports that she has a rash on the left side of her neck. It is painful, she has been putting alcohol and heat on it. Rash is shingles provider in triage room.

## 2021-08-11 ENCOUNTER — Other Ambulatory Visit: Payer: Self-pay | Admitting: Internal Medicine

## 2021-08-11 DIAGNOSIS — Z1231 Encounter for screening mammogram for malignant neoplasm of breast: Secondary | ICD-10-CM

## 2021-09-09 ENCOUNTER — Emergency Department: Payer: Medicare Other

## 2021-09-09 ENCOUNTER — Other Ambulatory Visit: Payer: Self-pay

## 2021-09-09 DIAGNOSIS — Z5321 Procedure and treatment not carried out due to patient leaving prior to being seen by health care provider: Secondary | ICD-10-CM | POA: Insufficient documentation

## 2021-09-09 DIAGNOSIS — R079 Chest pain, unspecified: Secondary | ICD-10-CM | POA: Insufficient documentation

## 2021-09-09 DIAGNOSIS — R1013 Epigastric pain: Secondary | ICD-10-CM | POA: Insufficient documentation

## 2021-09-09 LAB — CBC
HCT: 31.9 % — ABNORMAL LOW (ref 36.0–46.0)
Hemoglobin: 10.2 g/dL — ABNORMAL LOW (ref 12.0–15.0)
MCH: 24.9 pg — ABNORMAL LOW (ref 26.0–34.0)
MCHC: 32 g/dL (ref 30.0–36.0)
MCV: 78 fL — ABNORMAL LOW (ref 80.0–100.0)
Platelets: 295 10*3/uL (ref 150–400)
RBC: 4.09 MIL/uL (ref 3.87–5.11)
RDW: 13.9 % (ref 11.5–15.5)
WBC: 5.8 10*3/uL (ref 4.0–10.5)
nRBC: 0 % (ref 0.0–0.2)

## 2021-09-09 NOTE — ED Triage Notes (Signed)
EMS brings pt in from home for c/o CP that began while watching TV PTA

## 2021-09-09 NOTE — ED Triage Notes (Signed)
Pt presents to ER via ems from home with c/o central chest pain and epigastric pain that started tonight while she was watching tv.  Pt states pain is non-radiating, and denies any n/v, dizziness or sob at this time.  Pt denies any hx of heart problems.  Pt is A&O x4 at this time in NAD in triage.

## 2021-09-10 ENCOUNTER — Emergency Department
Admission: EM | Admit: 2021-09-10 | Discharge: 2021-09-10 | Discharge status: Against Medical Advice | Payer: Medicare Other | Attending: Emergency Medicine | Admitting: Emergency Medicine

## 2021-09-10 ENCOUNTER — Telehealth: Payer: Self-pay | Admitting: Emergency Medicine

## 2021-09-10 LAB — TROPONIN I (HIGH SENSITIVITY)
Troponin I (High Sensitivity): 3 ng/L (ref <18)
Troponin I (High Sensitivity): 4 ng/L (ref <18)

## 2021-09-10 LAB — COMPREHENSIVE METABOLIC PANEL
ALT: 53 U/L — ABNORMAL HIGH (ref 0–44)
AST: 110 U/L — ABNORMAL HIGH (ref 15–41)
Albumin: 4 g/dL (ref 3.5–5.0)
Alkaline Phosphatase: 43 U/L (ref 38–126)
Anion gap: 9 (ref 5–15)
BUN: 18 mg/dL (ref 8–23)
CO2: 28 mmol/L (ref 22–32)
Calcium: 10.4 mg/dL — ABNORMAL HIGH (ref 8.9–10.3)
Chloride: 96 mmol/L — ABNORMAL LOW (ref 98–111)
Creatinine, Ser: 0.88 mg/dL (ref 0.44–1.00)
GFR, Estimated: >60 mL/min (ref >60)
Glucose, Bld: 104 mg/dL — ABNORMAL HIGH (ref 70–99)
Potassium: 3.1 mmol/L — ABNORMAL LOW (ref 3.5–5.1)
Sodium: 133 mmol/L — ABNORMAL LOW (ref 135–145)
Total Bilirubin: 0.5 mg/dL (ref 0.3–1.2)
Total Protein: 7 g/dL (ref 6.5–8.1)

## 2021-09-10 LAB — LIPASE, BLOOD: Lipase: 2390 U/L — ABNORMAL HIGH (ref 11–51)

## 2021-09-10 NOTE — Telephone Encounter (Signed)
Called patient due to left emergency department before provider exam to inquire about condition and follow up plans.  She says she has no pain now and no vomiting.  I told her that she has elevated blood test--says she has history of pancreatitis years ago.  She has made appt with pcp on Tuesday.  Advised to call them again and ask them to review the labs to see if it is okay to wait until Tuesday to see MD.  Daughter came on phone and I gave her same instructiosn.  She will call now.

## 2021-09-10 NOTE — ED Notes (Signed)
IV removed from pt at this time per request.  Pt states she is not having any pain at this time and is wanting to go and see her PCP in the morning instead.  Pt encouraged to stay and be seen in ER.

## 2021-09-20 ENCOUNTER — Ambulatory Visit
Admission: RE | Admit: 2021-09-20 | Discharge: 2021-09-20 | Disposition: A | Discharge status: Home / Self Care | Payer: Medicare Other | Source: Ambulatory Visit | Attending: Internal Medicine | Admitting: Internal Medicine

## 2021-09-20 DIAGNOSIS — Z1231 Encounter for screening mammogram for malignant neoplasm of breast: Secondary | ICD-10-CM | POA: Insufficient documentation

## 2022-02-15 ENCOUNTER — Emergency Department: Payer: Medicare Other

## 2022-02-15 ENCOUNTER — Observation Stay
Admission: EM | Admit: 2022-02-15 | Discharge: 2022-02-16 | Disposition: A | Discharge status: Home / Self Care | Payer: Medicare Other | Attending: Internal Medicine | Admitting: Internal Medicine

## 2022-02-15 DIAGNOSIS — R6510 Systemic inflammatory response syndrome (SIRS) of non-infectious origin without acute organ dysfunction: Secondary | ICD-10-CM | POA: Insufficient documentation

## 2022-02-15 DIAGNOSIS — R410 Disorientation, unspecified: Secondary | ICD-10-CM | POA: Insufficient documentation

## 2022-02-15 DIAGNOSIS — E871 Hypo-osmolality and hyponatremia: Secondary | ICD-10-CM | POA: Diagnosis not present

## 2022-02-15 DIAGNOSIS — F17210 Nicotine dependence, cigarettes, uncomplicated: Secondary | ICD-10-CM | POA: Diagnosis not present

## 2022-02-15 DIAGNOSIS — J4489 Other specified chronic obstructive pulmonary disease: Secondary | ICD-10-CM | POA: Diagnosis not present

## 2022-02-15 DIAGNOSIS — Z79899 Other long term (current) drug therapy: Secondary | ICD-10-CM | POA: Diagnosis not present

## 2022-02-15 DIAGNOSIS — Z720 Tobacco use: Secondary | ICD-10-CM | POA: Diagnosis not present

## 2022-02-15 DIAGNOSIS — E876 Hypokalemia: Secondary | ICD-10-CM | POA: Diagnosis not present

## 2022-02-15 DIAGNOSIS — R531 Weakness: Secondary | ICD-10-CM | POA: Insufficient documentation

## 2022-02-15 DIAGNOSIS — Z85528 Personal history of other malignant neoplasm of kidney: Secondary | ICD-10-CM | POA: Diagnosis not present

## 2022-02-15 DIAGNOSIS — R5383 Other fatigue: Secondary | ICD-10-CM | POA: Diagnosis not present

## 2022-02-15 DIAGNOSIS — J40 Bronchitis, not specified as acute or chronic: Secondary | ICD-10-CM

## 2022-02-15 DIAGNOSIS — I10 Essential (primary) hypertension: Secondary | ICD-10-CM | POA: Diagnosis not present

## 2022-02-15 DIAGNOSIS — R059 Cough, unspecified: Secondary | ICD-10-CM | POA: Diagnosis not present

## 2022-02-15 DIAGNOSIS — Z1152 Encounter for screening for COVID-19: Secondary | ICD-10-CM | POA: Insufficient documentation

## 2022-02-15 DIAGNOSIS — J45909 Unspecified asthma, uncomplicated: Secondary | ICD-10-CM | POA: Diagnosis not present

## 2022-02-15 DIAGNOSIS — F419 Anxiety disorder, unspecified: Secondary | ICD-10-CM | POA: Insufficient documentation

## 2022-02-15 DIAGNOSIS — K219 Gastro-esophageal reflux disease without esophagitis: Secondary | ICD-10-CM | POA: Diagnosis present

## 2022-02-15 DIAGNOSIS — R509 Fever, unspecified: Secondary | ICD-10-CM | POA: Diagnosis not present

## 2022-02-15 LAB — COMPREHENSIVE METABOLIC PANEL WITH GFR
ALT: 52 U/L — ABNORMAL HIGH (ref 0–44)
AST: 69 U/L — ABNORMAL HIGH (ref 15–41)
Albumin: 2.7 g/dL — ABNORMAL LOW (ref 3.5–5.0)
Alkaline Phosphatase: 48 U/L (ref 38–126)
Anion gap: 6 (ref 5–15)
BUN: 18 mg/dL (ref 8–23)
CO2: 26 mmol/L (ref 22–32)
Calcium: 8.8 mg/dL — ABNORMAL LOW (ref 8.9–10.3)
Chloride: 99 mmol/L (ref 98–111)
Creatinine, Ser: 1 mg/dL (ref 0.44–1.00)
GFR, Estimated: 59 mL/min — ABNORMAL LOW (ref >60)
Glucose, Bld: 97 mg/dL (ref 70–99)
Potassium: 3.3 mmol/L — ABNORMAL LOW (ref 3.5–5.1)
Sodium: 131 mmol/L — ABNORMAL LOW (ref 135–145)
Total Bilirubin: 1 mg/dL (ref 0.3–1.2)
Total Protein: 5.6 g/dL — ABNORMAL LOW (ref 6.5–8.1)

## 2022-02-15 LAB — CBC WITH DIFFERENTIAL/PLATELET
Abs Immature Granulocytes: 0.06 K/uL (ref 0.00–0.07)
Basophils Absolute: 0 K/uL (ref 0.0–0.1)
Basophils Relative: 0 %
Eosinophils Absolute: 0 K/uL (ref 0.0–0.5)
Eosinophils Relative: 0 %
HCT: 27.5 % — ABNORMAL LOW (ref 36.0–46.0)
Hemoglobin: 8.9 g/dL — ABNORMAL LOW (ref 12.0–15.0)
Immature Granulocytes: 1 %
Lymphocytes Relative: 2 %
Lymphs Abs: 0.2 K/uL — ABNORMAL LOW (ref 0.7–4.0)
MCH: 25.3 pg — ABNORMAL LOW (ref 26.0–34.0)
MCHC: 32.4 g/dL (ref 30.0–36.0)
MCV: 78.1 fL — ABNORMAL LOW (ref 80.0–100.0)
Monocytes Absolute: 0.6 K/uL (ref 0.1–1.0)
Monocytes Relative: 5 %
Neutro Abs: 10.2 K/uL — ABNORMAL HIGH (ref 1.7–7.7)
Neutrophils Relative %: 92 %
Platelets: 177 K/uL (ref 150–400)
RBC: 3.52 MIL/uL — ABNORMAL LOW (ref 3.87–5.11)
RDW: 13.5 % (ref 11.5–15.5)
WBC: 11.1 K/uL — ABNORMAL HIGH (ref 4.0–10.5)
nRBC: 0 % (ref 0.0–0.2)

## 2022-02-15 LAB — PROTIME-INR
INR: 1.3 — ABNORMAL HIGH (ref 0.8–1.2)
Prothrombin Time: 15.9 s — ABNORMAL HIGH (ref 11.4–15.2)

## 2022-02-15 LAB — RESP PANEL BY RT-PCR (RSV, FLU A&B, COVID)  RVPGX2
Influenza A by PCR: NEGATIVE
Influenza B by PCR: NEGATIVE
Resp Syncytial Virus by PCR: NEGATIVE
SARS Coronavirus 2 by RT PCR: NEGATIVE

## 2022-02-15 LAB — URINALYSIS, ROUTINE W REFLEX MICROSCOPIC
Bacteria, UA: NONE SEEN
Bilirubin Urine: NEGATIVE
Glucose, UA: NEGATIVE mg/dL
Ketones, ur: NEGATIVE mg/dL
Leukocytes,Ua: NEGATIVE
Nitrite: NEGATIVE
Protein, ur: NEGATIVE mg/dL
Specific Gravity, Urine: 1.009 (ref 1.005–1.030)
pH: 7 (ref 5.0–8.0)

## 2022-02-15 LAB — PROCALCITONIN: Procalcitonin: 1.32 ng/mL

## 2022-02-15 LAB — LACTIC ACID, PLASMA
Lactic Acid, Venous: 2.7 mmol/L (ref 0.5–1.9)
Lactic Acid, Venous: 1.4 mmol/L (ref 0.5–1.9)

## 2022-02-15 LAB — APTT: aPTT: 30 s (ref 24–36)

## 2022-02-15 MED ORDER — TRAMADOL HCL 50 MG PO TABS: 50.0000 mg | ORAL_TABLET | Freq: Two times a day (BID) | ORAL | Status: DC | PRN

## 2022-02-15 MED ORDER — ENOXAPARIN SODIUM 40 MG/0.4ML IJ SOSY
40.0000 mg | PREFILLED_SYRINGE | INTRAMUSCULAR | Status: DC
  Administered 2022-02-15: 40 mg via SUBCUTANEOUS
  Filled 2022-02-15: qty 0.4

## 2022-02-15 MED ORDER — SODIUM CHLORIDE 0.9 % IV SOLN
2.0000 g | Freq: Once | INTRAVENOUS | Status: AC
  Administered 2022-02-15: 2 g via INTRAVENOUS
  Filled 2022-02-15: qty 12.5

## 2022-02-15 MED ORDER — PREDNISONE 20 MG PO TABS
40.0000 mg | ORAL_TABLET | Freq: Every day | ORAL | Status: DC
  Administered 2022-02-16: 40 mg via ORAL
  Filled 2022-02-15: qty 2

## 2022-02-15 MED ORDER — LACTATED RINGERS IV SOLN: INTRAVENOUS | Status: DC

## 2022-02-15 MED ORDER — ALBUTEROL SULFATE HFA 108 (90 BASE) MCG/ACT IN AERS: 1.0000 | INHALATION_SPRAY | Freq: Four times a day (QID) | RESPIRATORY_TRACT | Status: DC | PRN

## 2022-02-15 MED ORDER — VITAMIN D 25 MCG (1000 UNIT) PO TABS
1000.0000 [IU] | ORAL_TABLET | Freq: Every day | ORAL | Status: DC
  Administered 2022-02-15 – 2022-02-16 (×2): 1000 [IU] via ORAL
  Filled 2022-02-15 (×2): qty 1

## 2022-02-15 MED ORDER — MOMETASONE FURO-FORMOTEROL FUM 200-5 MCG/ACT IN AERO
2.0000 | INHALATION_SPRAY | Freq: Two times a day (BID) | RESPIRATORY_TRACT | Status: DC
  Administered 2022-02-16: 2 via RESPIRATORY_TRACT
  Filled 2022-02-15: qty 8.8

## 2022-02-15 MED ORDER — FERROUS SULFATE 325 (65 FE) MG PO TABS
325.0000 mg | ORAL_TABLET | Freq: Every day | ORAL | Status: DC
  Administered 2022-02-15 – 2022-02-16 (×2): 325 mg via ORAL
  Filled 2022-02-15 (×2): qty 1

## 2022-02-15 MED ORDER — DOXYCYCLINE HYCLATE 100 MG PO TABS
100.0000 mg | ORAL_TABLET | Freq: Two times a day (BID) | ORAL | Status: DC
  Administered 2022-02-15 – 2022-02-16 (×2): 100 mg via ORAL
  Filled 2022-02-15 (×2): qty 1

## 2022-02-15 MED ORDER — IPRATROPIUM-ALBUTEROL 0.5-2.5 (3) MG/3ML IN SOLN
3.0000 mL | Freq: Once | RESPIRATORY_TRACT | Status: AC
  Administered 2022-02-15: 3 mL via RESPIRATORY_TRACT

## 2022-02-15 MED ORDER — OXYBUTYNIN CHLORIDE 5 MG PO TABS: 5.0000 mg | ORAL_TABLET | Freq: Two times a day (BID) | ORAL | Status: DC

## 2022-02-15 MED ORDER — POTASSIUM CHLORIDE CRYS ER 20 MEQ PO TBCR
40.0000 meq | EXTENDED_RELEASE_TABLET | Freq: Once | ORAL | Status: AC
  Administered 2022-02-15: 40 meq via ORAL
  Filled 2022-02-15: qty 2

## 2022-02-15 MED ORDER — ONDANSETRON HCL 4 MG/2ML IJ SOLN: 4.0000 mg | Freq: Four times a day (QID) | INTRAMUSCULAR | Status: DC | PRN

## 2022-02-15 MED ORDER — PANTOPRAZOLE SODIUM 20 MG PO TBEC: 20.0000 mg | DELAYED_RELEASE_TABLET | Freq: Every day | ORAL | Status: DC | PRN

## 2022-02-15 MED ORDER — ACETAMINOPHEN 500 MG PO TABS: 500.0000 mg | ORAL_TABLET | Freq: Four times a day (QID) | ORAL | Status: DC | PRN

## 2022-02-15 MED ORDER — SODIUM CHLORIDE 0.9 % IV SOLN: INTRAVENOUS | Status: AC

## 2022-02-15 MED ORDER — ONDANSETRON HCL 4 MG PO TABS: 4.0000 mg | ORAL_TABLET | Freq: Four times a day (QID) | ORAL | Status: DC | PRN

## 2022-02-15 MED ORDER — LORATADINE 10 MG PO TABS
10.0000 mg | ORAL_TABLET | Freq: Every day | ORAL | Status: DC
  Administered 2022-02-15 – 2022-02-16 (×2): 10 mg via ORAL
  Filled 2022-02-15 (×2): qty 1

## 2022-02-15 NOTE — Assessment & Plan Note (Signed)
Hold hydrochlorothiazide for now

## 2022-02-15 NOTE — Progress Notes (Signed)
CODE SEPSIS - PHARMACY COMMUNICATION  **Broad Spectrum Antibiotics should be administered within 1 hour of Sepsis diagnosis**  Time Code Sepsis Called/Page Received: 1003  Antibiotics Ordered: cefepime  Time of 1st antibiotic administration: 1239  Additional action taken by pharmacy: Messaged provider about no Abx being ordered inititally.  Provider wished to wait for viral panel to come back.  If necessary, Name of Provider/Nurse Contacted: Delman Kitten, MD   Lorin Picket ,PharmD Clinical Pharmacist  02/15/2022  11:33 AM

## 2022-02-15 NOTE — Assessment & Plan Note (Signed)
Continue PPI

## 2022-02-15 NOTE — ED Notes (Signed)
Jacqualine Code, MD, made aware of critical lactic 2.7

## 2022-02-15 NOTE — Assessment & Plan Note (Signed)
Secondary to HCTZ use Hold hydrochlorothiazide Hydrate patient with normal saline

## 2022-02-15 NOTE — Assessment & Plan Note (Addendum)
In a patient with underlying COPD who presents to the ER for evaluation of a fever and a productive cough She was noted to be tachycardic upon arrival to the ER but has resolved with IV fluid hydration Procalcitonin is elevated, patient has a downtrending lactic acid and a white count of 11 Start patient on doxycycline and systemic steroid Continue bronchodilator and inhaled steroids

## 2022-02-15 NOTE — Progress Notes (Signed)
Elink following code sepsis

## 2022-02-15 NOTE — Assessment & Plan Note (Signed)
Treatment as outlined in 1

## 2022-02-15 NOTE — Plan of Care (Signed)

## 2022-02-15 NOTE — Progress Notes (Signed)
Notified provider of need to order antibiotics.   

## 2022-02-15 NOTE — ED Triage Notes (Signed)
Pt to  ED via [facility]. Daughter called EMS for pt's altered status. Last known well at 6:00 yesterday morning. When EMS arrived on scene, pt had temp of 103.5, heart rate in 120s, and BP of 90/60. 500 mL NS given by EMS.

## 2022-02-15 NOTE — Progress Notes (Signed)
Notified bedside nurse of need to draw blood cultures.  

## 2022-02-15 NOTE — H&P (Addendum)
History and Physical    Patient: Crystal Bailey TDV:[id number] DOB: [birth year] DOA: 02/15/2022 DOS: the patient was seen and examined on 02/15/2022 PCP: Baxter Hire, MD  Patient coming from: Home  Chief Complaint:  Chief Complaint  Patient presents with   Altered Mental Status   HPI: Crystal Bailey is a 72 y.o. female with medical history significant for hypertension, GERD, COPD, nicotine dependence who was brought into the ER by [facility] for evaluation of a fever at home. Per EMS she had a Tmax of 103.5, she was tachycardic with heart rates in the 120s and blood pressure 90/60.  She received 500 cc fluid bolus by EMS with improvement in her blood pressure. She has a cough productive of yellow phlegm but denies having any shortness of breath or wheezing. She denies having any chest pain, no nausea, no vomiting, no changes in her bowel habits, no urinary symptoms, no leg swelling, no palpitations or diaphoresis. She states that she fell several days prior to her hospitalization and describes a mechanical fall where she tripped on a stump and hurt her left knee.  She did have some left knee swelling but that has improved and she denies having any pain. Abnormal labs include lactate 2.7 >> 1.4, procalcitonin 1.32, sodium 131, potassium 3.3, white count 11.1, AST 69, ALT 52 Respiratory viral panel is negative Chest x-ray shows clear lungs She received a dose of cefepime in the ER and nebulizer treatment and will be referred to observation status for further evaluation.   Review of Systems: As mentioned in the history of present illness. All other systems reviewed and are negative. Past Medical History:  Diagnosis Date   Anemia    Anxiety    Arthritis    Asthma    Cancer ([place]) 2011   kidney   GERD (gastroesophageal reflux disease)    Hip pain    Lt hip   Hypertension    Lack of bladder control    Past Surgical History:  Procedure Laterality Date   ABDOMINAL HYSTERECTOMY      bilateral M&T Bilateral    BUNIONECTOMY Right    CARPAL TUNNEL RELEASE     COLONOSCOPY WITH PROPOFOL N/A 09/18/2014   Procedure: COLONOSCOPY WITH PROPOFOL;  Surgeon: Hulen Luster, MD;  Location: [facility] ENDOSCOPY;  Service: Gastroenterology;  Laterality: N/A;   HALLUX VALGUS AKIN Left 06/10/2016   Procedure: HALLUX VALGUS AKIN-PHALANX OSTEOTOMY;  Surgeon: Samara Deist, DPM;  Location: [facility] ORS;  Service: Podiatry;  Laterality: Left;   HALLUX VALGUS AUSTIN Left 06/10/2016   Procedure: HALLUX VALGUS AUSTIN;  Surgeon: Samara Deist, DPM;  Location: [facility] ORS;  Service: Podiatry;  Laterality: Left;   KIDNEY STONE SURGERY     KIDNEY SURGERY     Social History:  reports that she has been smoking cigarettes. She has been smoking an average of .25 packs per day. Her smokeless tobacco use includes snuff. She reports that she does not drink alcohol and does not use drugs.  No Known Allergies  Family History  Problem Relation Age of Onset   Breast cancer Mother 72   Breast cancer Sister 89    Prior to Admission medications   Medication Sig Start Date End Date Taking? Authorizing Provider  acetaminophen (TYLENOL) 500 MG tablet Take 500 mg by mouth every 6 (six) hours as needed for moderate pain or headache.     [provider]  albuterol (PROVENTIL HFA;VENTOLIN HFA) 108 (90 BASE) MCG/ACT inhaler Inhale 1-2 puffs into  the lungs every 6 (six) hours as needed for wheezing or shortness of breath.     [provider]  cetirizine (ZYRTEC) 10 MG tablet Take 10 mg by mouth daily.    [provider]  cholecalciferol (VITAMIN D) 1000 units tablet Take 1,000 Units by mouth daily.    [provider]  diclofenac sodium (VOLTAREN) 1 % GEL Apply 2 g topically 4 (four) times daily.    [provider]  famciclovir (FAMVIR) 500 MG tablet Take 1 tablet (500 mg total) by mouth 3 (three) times daily. 11/29/20   Versie Starks, PA-C  ferrous sulfate 325 (65 FE) MG EC tablet Take  325 mg by mouth daily.    [provider]  Fluticasone-Salmeterol (ADVAIR) 250-50 MCG/DOSE AEPB Inhale 1 puff into the lungs every 12 (twelve) hours.     [provider]  hydrochlorothiazide (HYDRODIURIL) 25 MG tablet Take 25 mg by mouth daily.  04/29/16   [provider]  methylPREDNISolone (MEDROL DOSEPAK) 4 MG TBPK tablet Take 6 pills on day one then decrease by 1 pill each day 11/29/20   Versie Starks, PA-C  omeprazole (PRILOSEC OTC) 20 MG tablet Take 20 mg by mouth 3 (three) times daily as needed (acid reflux).    [provider]  oxybutynin (DITROPAN) 5 MG tablet Take 5 mg by mouth 2 (two) times daily.     [provider]  traMADol (ULTRAM) 50 MG tablet Take 50-100 mg by mouth daily as needed for moderate pain.     [provider]    Physical Exam: Vitals:   02/15/22 1115 02/15/22 1130 02/15/22 1207 02/15/22 1230  BP: (!) 116/55 (!) 119/58 108/65 (!) 114/58  Pulse: 71 82 73 72  Resp: 19 (!) '25 18 20  '$ Temp:      TempSrc:      SpO2: 100% 100% 99% 98%   Physical Exam Vitals and nursing note reviewed.  Constitutional:      Appearance: Normal appearance.  HENT:     Head: Normocephalic and atraumatic.     Nose: Nose normal.     Mouth/Throat:     Mouth: Mucous membranes are moist.  Eyes:     Conjunctiva/sclera: Conjunctivae normal.  Cardiovascular:     Rate and Rhythm: Normal rate and regular rhythm.  Pulmonary:     Breath sounds: Wheezing present.  Abdominal:     General: Abdomen is flat. Bowel sounds are normal.     Palpations: Abdomen is soft.  Musculoskeletal:        General: Normal range of motion.     Cervical back: Normal range of motion and neck supple.  Skin:    General: Skin is warm and dry.  Neurological:     General: No focal deficit present.     Mental Status: She is alert.  Psychiatric:        Mood and Affect: Mood normal.        Behavior: Behavior normal.     Data Reviewed: Relevant notes from  primary care and specialist visits, past discharge summaries as available in EHR, including Care Everywhere. Prior diagnostic testing as pertinent to current admission diagnoses Updated medications and problem lists for reconciliation ED course, including vitals, labs, imaging, treatment and response to treatment Triage notes, nursing and pharmacy notes and ED provider's notes Notable results as noted in HPI Labs reviewed.  Lactate shows a downward trend 2.7 >> 1.4, procalcitonin 1.32, sodium 131, potassium 3.3, chloride 99, bicarb 26,  glucose 97, BUN 18, creatinine 1.0, calcium 8.8, total protein 5.6, albumin 2.7, AST 69, ALT 52, alkaline phosphatase 48, PT 15.9, INR 1.3, white count 11.1, hemoglobin 8.9, hematocrit 27.5, platelet count 177 Left knee x-ray shows Chondrocalcinosis is noted most consistent with early degenerative joint disease or possibly calcium pyrophosphate deposition disease. Moderate degenerative changes are noted medially with possible calcific bursitis it suprapatellar region. No acute abnormality is noted. Chest x-ray reviewed by me shows clear lungs CT scan of the head without contrast is negative for any acute findings Twelve-lead EKG reviewed by me shows sinus rhythm There are no new results to review at this time.  Assessment and Plan: * Bronchitis due to tobacco use In a patient with underlying COPD who presents to the ER for evaluation of a fever and a productive cough She was noted to be tachycardic upon arrival to the ER but has resolved with IV fluid hydration Procalcitonin is elevated, patient has a downtrending lactic acid and a white count of 11 Start patient on doxycycline and systemic steroid Continue bronchodilator and inhaled steroids  Systemic inflammatory response syndrome (SIRS) without organ dysfunction (HCC) Treatment as outlined in 1  Hyponatremia Secondary to HCTZ use Hold hydrochlorothiazide Hydrate patient with normal  saline  Hypokalemia Secondary to diuretic therapy Supplement potassium  GERD (gastroesophageal reflux disease) Continue PPI  Hypertension Hold hydrochlorothiazide for now      Advance Care Planning:   Code Status: Full Code   Consults: None  Family Communication: Greater than 50% of time was spent discussing patient's condition and plan of care with her at the bedside.  All questions and concerns have been addressed.  She verbalizes understanding and agrees to the plan.  Severity of Illness: The appropriate patient status for this patient is OBSERVATION. Observation status is judged to be reasonable and necessary in order to provide the required intensity of service to ensure the patient's safety. The patient's presenting symptoms, physical exam findings, and initial radiographic and laboratory data in the context of their medical condition is felt to place them at decreased risk for further clinical deterioration. Furthermore, it is anticipated that the patient will be medically stable for discharge from the hospital within 2 midnights of admission.   Author: Collier Bullock, MD 02/15/2022 1:30 PM  For on call review [email or web address].

## 2022-02-15 NOTE — Assessment & Plan Note (Signed)
Secondary to diuretic therapy  Supplement potassium 

## 2022-02-15 NOTE — ED Provider Notes (Signed)
[facility] Provider Note    Event Date/Time   First MD Initiated Contact with Patient 02/15/22 [phone]     (approximate)   History     HPI  SAMARA STANKOWSKI is a 72 y.o. female history of COPD, as well as hypertension and on review of previous history and physical from 2018 has a history of hallux valgus deformity  EMS called to patient's home for weakness.  EMS reports patient had a fever of 103 Fahrenheit, also initial blood pressure of 90 systolic.  She has been complaining of weakness and fatigue and had some confusion at the home.  Her confusion seems to have improved.  She is received a total of 1.7 L of prehospital fluid combination of normal saline and lactated Ringer's at the blood pressure improving to normotensive and heart rate improving from the 120s down to 90  Patient reports she feels okay.  She does have a cough and has been having some chills and felt too weak to get up this morning despite trying multiple times.  As she fell in the yard on Thursday reports she stumbled over a stump in her left knee has been a little swollen but it is not causing her any pain and she is able to walk on it.  She denies any head injury or other pain.  Right now she is in no pain or discomfort other than the IV at her right forearm is pinching her at times   Does not feel short of breath but reports a slight amount of wheezing.  Reports she typically would use her inhaler every day but she has not had it yet today.  She does report a cough and fever  Physical Exam   Triage Vital Signs: ED Triage Vitals  Enc Vitals Group     BP      Pulse      Resp      Temp      Temp src      SpO2      Weight      Height      Head Circumference      Peak Flow      Pain Score      Pain Loc      Pain Edu?      Excl. in [place]?     Most recent vital signs: Vitals:   02/15/22 1207 02/15/22 1230  BP: 108/65 (!) 114/58  Pulse: 73 72  Resp: 18 20  Temp:    SpO2: 99% 98%      General: Awake, no distress.  Very pleasant.  Mucous membranes are mildly dry.  She is alert she is oriented to location, situation, day and month but reports the year to be 2023. CV:  Good peripheral perfusion.  Normal tones and rate Resp:  Normal effort.  Slight end expiratory wheezing in the lung fields bilaterally.  Occasional cough with slightly productive sputum.  No bloody sputum.  Reports she does not feel short of breath other than she would typically just use her inhaler treatment Abd:  No distention.  Soft nontender nondistended Other:  Moves all extremities well without deficit.  She does not have any warmth or erythema overlying the left knee but there is a small effusion without obvious deformity.  She ranges it well without pain or discomfort.   ED Results / Procedures / Treatments   Labs (all labs ordered are listed, but only abnormal results are displayed) Labs  Reviewed  LACTIC ACID, PLASMA - Abnormal; Notable for the following components:      Result Value   Lactic Acid, Venous 2.7 (*)    All other components within normal limits  COMPREHENSIVE METABOLIC PANEL - Abnormal; Notable for the following components:   Sodium 131 (*)    Potassium 3.3 (*)    Calcium 8.8 (*)    Total Protein 5.6 (*)    Albumin 2.7 (*)    AST 69 (*)    ALT 52 (*)    GFR, Estimated 59 (*)    All other components within normal limits  CBC WITH DIFFERENTIAL/PLATELET - Abnormal; Notable for the following components:   WBC 11.1 (*)    RBC 3.52 (*)    Hemoglobin 8.9 (*)    HCT 27.5 (*)    MCV 78.1 (*)    MCH 25.3 (*)    Neutro Abs 10.2 (*)    Lymphs Abs 0.2 (*)    All other components within normal limits  PROTIME-INR - Abnormal; Notable for the following components:   Prothrombin Time 15.9 (*)    INR 1.3 (*)    All other components within normal limits  URINALYSIS, ROUTINE W REFLEX MICROSCOPIC - Abnormal; Notable for the following components:   Color, Urine YELLOW (*)    APPearance  CLEAR (*)    Hgb urine dipstick MODERATE (*)    All other components within normal limits  RESP PANEL BY RT-PCR (RSV, FLU A&B, COVID)  RVPGX2  CULTURE, BLOOD (ROUTINE X 2)  CULTURE, BLOOD (ROUTINE X 2)  RESPIRATORY PANEL BY PCR  LACTIC ACID, PLASMA  APTT  PROCALCITONIN     EKG  And interpreted by me at 1125 heart rate 80 QRS 80 QTc 400 Normal sinus rhythm, old Q waves in the septal distribution.  No evidence of acute ischemia or STEMI   RADIOLOGY  I personally interpreted the patient's CT scan of the head as negative for acute intracranial trauma on gross inspection  Patient's chest x-ray interpreted by me as negative for acute finding.  Left knee x-ray interpreted as negative for fracture.  CT Head Wo Contrast  Result Date: 02/15/2022 CLINICAL DATA:  Recent fall, confusion EXAM: CT HEAD WITHOUT CONTRAST TECHNIQUE: Contiguous axial images were obtained from the base of the skull through the vertex without intravenous contrast. RADIATION DOSE REDUCTION: This exam was performed according to the departmental dose-optimization program which includes automated exposure control, adjustment of the mA and/or kV according to patient size and/or use of iterative reconstruction technique. COMPARISON:  05/18/2007 FINDINGS: Brain: No evidence of acute infarction, hemorrhage, hydrocephalus, extra-axial collection or mass lesion/mass effect. Vascular: No hyperdense vessel or unexpected calcification. Skull: Normal. Negative for fracture or focal lesion. Sinuses/Orbits: Paranasal sinuses are clear. Bilateral mastoidectomies. Other: None. IMPRESSION: No acute intracranial abnormality. Electronically Signed   By: Davina Poke D.O.   On: 02/15/2022 10:32   DG Knee Left Port  Result Date: 02/15/2022 CLINICAL DATA:  Left knee swelling after fall last week. EXAM: PORTABLE LEFT KNEE - 1-2 VIEW COMPARISON:  None Available. FINDINGS: No evidence of fracture, dislocation, or joint effusion.  Chondrocalcinosis is noted medially and laterally, with moderate osteophyte formation seen medially. No significant joint space narrowing is noted. Calcifications seen in suprapatella bursa region suggesting calcific bursitis. Soft tissues are unremarkable. IMPRESSION: Chondrocalcinosis is noted most consistent with early degenerative joint disease or possibly calcium pyrophosphate deposition disease. Moderate degenerative changes are noted medially with possible calcific bursitis it suprapatellar region. No  acute abnormality is noted. Electronically Signed   By: Marijo Conception M.D.   On: 02/15/2022 10:28   DG Chest Port 1 View  Result Date: 02/15/2022 CLINICAL DATA:  Sepsis, fall. EXAM: PORTABLE CHEST 1 VIEW COMPARISON:  September 09, 2021. FINDINGS: The heart size and mediastinal contours are within normal limits. Both lungs are clear. The visualized skeletal structures are unremarkable. IMPRESSION: No active disease. Electronically Signed   By: Marijo Conception M.D.   On: 02/15/2022 10:25    PROCEDURES:  Critical Care performed: No  Procedures   MEDICATIONS ORDERED IN ED: Medications  ceFEPIme (MAXIPIME) 2 g in sodium chloride 0.9 % 100 mL IVPB (2 g Intravenous New Bag/Given 02/15/22 1239)  ipratropium-albuterol (DUONEB) 0.5-2.5 (3) MG/3ML nebulizer solution 3 mL (3 mLs Nebulization Given 02/15/22 1105)     IMPRESSION / MDM / ASSESSMENT AND PLAN / ED COURSE  I reviewed the triage vital signs and the nursing notes.                              Patient presents with fever, report of confusion and weakness at the home but also suspect hypotensive blood pressure in the 90s heart rate of 120 on EMS arrival but received volume resuscitation greater than 30 mL/kg of weight and she is now normotensive with normal rate.  She endorses fever and cough.  Have ordered a CT of the head as she reports a fall on Thursday with also a small effusion of the left knee but it does not appear to be any sort of an  obvious source of infection, very small amount of effusion no warmth no erythema and she is able to range it well.  CT head to exclude intracranial hemorrhage.  No meningismus no headache.  Wish to exclude traumatic injury as a potential cause of her confusion though my suspicion with her fever 103 is that there is likely infectious etiology.  She denies any urinary or abdominal symptoms.  Code sepsis initiated, but awaiting viral studies and further workup prior to decision to initiate antibiotic at this point  Labs interpreted as mild leukocytosis, also some anemia but also both of these are interpreted in the setting of recently receiving just short of 2 L of crystalloid fluid prior to arrival.  Suspect some element of hemodilution    ----------------------------------------- 12:25 PM on 02/15/2022 ----------------------------------------- No clear source for the patient's high fever yet identified.  She denies abdominal pain.  She is awake alert oriented  Patient's presentation is most consistent with acute presentation with potential threat to life or bodily function.   The patient is on the cardiac monitor to evaluate for evidence of arrhythmia and/or significant heart rate changes.   Clinical Course as of 02/15/22 1251  Tue Feb 15, 2022  1052 Code sepsis initiated based on EMS sepsis alert.  Based on the patient's symptoms, suspicion of potential viral respiratory syndrome.  Awaiting COVID and influenza testing.  If her viral studies are negative, I believe antibiotics would be indicated.  However, if COVID or influenza test is positive would consider treatment with possible antivirals instead.  Will add procalcitonin [MQ]  1143 Patient with a moderately elevated lactic acid.  She has not had any hypotension or lactic acid greater than 4.  She does not currently meet criteria for septic shock. Awaiting viral studies. CXR negative for acute, UA without infection (no culture ordered based  on new memo  from infectious disease) [MQ]    Clinical Course User Index [MQ] Delman Kitten, MD   ----------------------------------------- 12:50 PM on 02/15/2022 ----------------------------------------- Vitals:   02/15/22 1207 02/15/22 1230  BP: 108/65 (!) 114/58  Pulse: 73 72  Resp: 18 20  Temp:    SpO2: 99% 98%    Low-grade fever remains.  Based on the patient's presentation with hypotension tachycardia responsiveness to fluid bolus by EMS as well as still moderately elevated lactic acid and SIRS will empirically provide cefepime and have consulted the hospitalist for evaluation for consideration for observation or admission in the setting of concerning symptomatology with elevated lactic acid, leukocytosis, tachycardia and hypotension on EMS arrival.  Patient's mental status is now improved and she is no longer confused.  Family at bedside patient understanding of the plan for consultation with her hospitalist service, after discussing case with Dr. Francine Graven anticipate observation  FINAL CLINICAL IMPRESSION(S) / ED DIAGNOSES   Final diagnoses:  SIRS (systemic inflammatory response syndrome) ([place])  Bronchitis     Rx / DC Orders   ED Discharge Orders     None        Note:  This document was prepared using Dragon voice recognition software and may include unintentional dictation errors.   Delman Kitten, MD 02/15/22 1311

## 2022-02-16 DIAGNOSIS — R6510 Systemic inflammatory response syndrome (SIRS) of non-infectious origin without acute organ dysfunction: Secondary | ICD-10-CM

## 2022-02-16 DIAGNOSIS — J40 Bronchitis, not specified as acute or chronic: Secondary | ICD-10-CM | POA: Diagnosis not present

## 2022-02-16 DIAGNOSIS — K219 Gastro-esophageal reflux disease without esophagitis: Secondary | ICD-10-CM | POA: Diagnosis not present

## 2022-02-16 DIAGNOSIS — Z720 Tobacco use: Secondary | ICD-10-CM | POA: Diagnosis not present

## 2022-02-16 LAB — BASIC METABOLIC PANEL
Anion gap: 4 — ABNORMAL LOW (ref 5–15)
BUN: 16 mg/dL (ref 8–23)
CO2: 26 mmol/L (ref 22–32)
Calcium: 9 mg/dL (ref 8.9–10.3)
Chloride: 105 mmol/L (ref 98–111)
Creatinine, Ser: 0.78 mg/dL (ref 0.44–1.00)
GFR, Estimated: >60 mL/min (ref >60)
Glucose, Bld: 108 mg/dL — ABNORMAL HIGH (ref 70–99)
Potassium: 3.5 mmol/L (ref 3.5–5.1)
Sodium: 135 mmol/L (ref 135–145)

## 2022-02-16 LAB — CBC
HCT: 26.1 % — ABNORMAL LOW (ref 36.0–46.0)
Hemoglobin: 8.6 g/dL — ABNORMAL LOW (ref 12.0–15.0)
MCH: 25 pg — ABNORMAL LOW (ref 26.0–34.0)
MCHC: 33 g/dL (ref 30.0–36.0)
MCV: 75.9 fL — ABNORMAL LOW (ref 80.0–100.0)
Platelets: 160 10*3/uL (ref 150–400)
RBC: 3.44 MIL/uL — ABNORMAL LOW (ref 3.87–5.11)
RDW: 13.4 % (ref 11.5–15.5)
WBC: 8.4 10*3/uL (ref 4.0–10.5)
nRBC: 0 % (ref 0.0–0.2)

## 2022-02-16 LAB — RESPIRATORY PANEL BY PCR

## 2022-02-16 MED ORDER — HYDROCHLOROTHIAZIDE 25 MG PO TABS: 25.0000 mg | ORAL_TABLET | Freq: Every day | ORAL | Status: AC

## 2022-02-16 MED ORDER — DOXYCYCLINE HYCLATE 100 MG PO TABS: 100.0000 mg | ORAL_TABLET | Freq: Two times a day (BID) | ORAL | 0 refills | Status: AC

## 2022-02-16 MED ORDER — PREDNISONE 20 MG PO TABS: 40.0000 mg | ORAL_TABLET | Freq: Every day | ORAL | 0 refills | Status: AC

## 2022-02-16 NOTE — Progress Notes (Signed)
Pt BP is 95/47, MD made aware. No new orders. Per MD pt okay for discharge today.

## 2022-02-16 NOTE — Progress Notes (Signed)
Pt discharged from hospital. IV out and pressure dressing applied. Pt wheeled down to medical mall entrance. Discharge instructions given to pt and pt's daughter.

## 2022-02-16 NOTE — TOC Transition Note (Signed)
Transition of Care [facility]) - CM/SW Discharge Note   Patient Details  Name: Crystal Bailey MRN: [id number] Date of Birth: 30-May-1950  [facility]) CM/SW Contact:  Quin Hoop, LCSW Phone Number: 02/16/2022, 11:00 AM   Clinical Narrative:    Patient discharging home with family to pick her up.  CSW delivered Code 44 form to patient and informed her of what it is.  CSW stated to patient that, if she has any questions, she can call the number listed at bottom of form (highlighted and circled for patient).  No other TOC needs.   Final next level of care: Home/Self Care Barriers to Discharge: No Barriers Identified   Patient Goals and CMS Choice      Discharge Placement:  Home                         Discharge Plan and Services Additional resources added to the After Visit Summary for                                       Social Determinants of Health (SDOH) Interventions SDOH Screenings   Food Insecurity: No Food Insecurity (02/15/2022)  Housing: Low Risk  (02/15/2022)  Transportation Needs: No Transportation Needs (02/15/2022)  Utilities: Not At Risk (02/15/2022)  Tobacco Use: High Risk (02/15/2022)     Readmission Risk Interventions     No data to display

## 2022-02-16 NOTE — Progress Notes (Signed)
  Transition of Care [facility]) Screening Note   Patient Details  Name: Crystal Bailey Date of Birth: 1950-12-18   Transition of Care [facility]) CM/SW Contact:    Quin Hoop, LCSW Phone Number: 02/16/2022, 9:18 AM    Transition of Care Department [facility]) has reviewed patient and no TOC needs have been identified at this time. We will continue to monitor patient advancement through interdisciplinary progression rounds. If new patient transition needs arise, please place a TOC consult.

## 2022-02-16 NOTE — Discharge Summary (Signed)
Physician Discharge Summary   Patient: Crystal Bailey MRN: [id number] DOB: 1950-06-07  Admit date:     02/15/2022  Discharge date: 02/16/22  Discharge Physician: Lorella Nimrod   PCP: Baxter Hire, MD   Recommendations at discharge:  Please obtain CBC and BMP in 1 week We are holding her home HCTZ, please review and restart if appropriate during follow-up appointment. Follow-up with primary care provider within a week  Discharge Diagnoses: Principal Problem:   Bronchitis Active Problems:   Systemic inflammatory response syndrome (SIRS) without organ dysfunction (HCC)   Hypertension   GERD (gastroesophageal reflux disease)   Hypokalemia   Hyponatremia   Hospital Course: Close taken from H&P.  Crystal Bailey is a 72 y.o. female with medical history significant for hypertension, GERD, COPD, nicotine dependence who was brought into the ER by [facility] for evaluation of a fever at home. Per EMS she had a Tmax of 103.5, she was tachycardic with heart rates in the 120s and blood pressure 90/60.  She received 500 cc fluid bolus by EMS with improvement in her blood pressure. She has a cough productive of yellow phlegm but denies having any shortness of breath or wheezing. She states that she fell several days prior to her hospitalization and describes a mechanical fall where she tripped on a stump and hurt her left knee.  She did have some left knee swelling but that has improved and she denies having any pain.   Abnormal labs include lactate 2.7 >> 1.4, procalcitonin 1.32, sodium 131, potassium 3.3, white count 11.1, AST 69, ALT 52 Respiratory viral panel is negative Chest x-ray shows clear lungs Left knee x-ray was negative for any acute abnormality, did show chronic findings consistent with degenerative joint disease. She received a dose of cefepime in the ER and nebulizer treatment .  1/23: Afebrile this morning, on room air.  Respiratory viral panel negative.  Resolution of  mild hyponatremia and hypokalemia.  Leukocytosis also resolved.  UA not consistent with UTI. Preliminary blood cultures negative.  Patient think that she is at her baseline and was asking to go home.  She is being discharged on 4 more days of prednisone and doxycycline.  She was also having borderline soft blood pressure, she was asked to hold HCTZ until she see her primary care doctor and they can restart if appropriate.  Patient will continue the rest of her home medications and need to have close follow-up with her providers for further recommendations.   Assessment and Plan: * Bronchitis In a patient with underlying COPD who presents to the ER for evaluation of a fever and a productive cough She was noted to be tachycardic upon arrival to the ER but has resolved with IV fluid hydration Procalcitonin is elevated, patient has a downtrending lactic acid and a white count of 11 Start patient on doxycycline and systemic steroid Continue bronchodilator and inhaled steroids  Systemic inflammatory response syndrome (SIRS) without organ dysfunction ([place]) Treatment as outlined in 1  Hyponatremia Secondary to HCTZ use Hold hydrochlorothiazide Hydrate patient with normal saline  Hypokalemia Secondary to diuretic therapy Supplement potassium  GERD (gastroesophageal reflux disease) Continue PPI  Hypertension Hold hydrochlorothiazide for now   Consultants: None Procedures performed: None Disposition: Home Diet recommendation:  Discharge Diet Orders (From admission, onward)     Start     Ordered   02/16/22 0000  Diet - low sodium heart healthy        02/16/22 1037  Cardiac diet DISCHARGE MEDICATION: Allergies as of 02/16/2022   No Known Allergies      Medication List     STOP taking these medications    famciclovir 500 MG tablet Commonly known as: FAMVIR   methylPREDNISolone 4 MG Tbpk tablet Commonly known as: MEDROL DOSEPAK   oxybutynin 5 MG  tablet Commonly known as: DITROPAN   traMADol 50 MG tablet Commonly known as: ULTRAM       TAKE these medications    acetaminophen 500 MG tablet Commonly known as: TYLENOL Take 500 mg by mouth every 6 (six) hours as needed for moderate pain or headache.   albuterol 108 (90 Base) MCG/ACT inhaler Commonly known as: VENTOLIN HFA Inhale 1-2 puffs into the lungs every 6 (six) hours as needed for wheezing or shortness of breath.   cetirizine 10 MG tablet Commonly known as: ZYRTEC Take 10 mg by mouth daily.   cholecalciferol 1000 units tablet Commonly known as: VITAMIN D Take 1,000 Units by mouth daily.   diclofenac sodium 1 % Gel Commonly known as: VOLTAREN Apply 2 g topically 4 (four) times daily.   doxycycline 100 MG tablet Commonly known as: VIBRA-TABS Take 1 tablet (100 mg total) by mouth every 12 (twelve) hours for 5 days.   ferrous sulfate 325 (65 FE) MG EC tablet Take 325 mg by mouth daily.   Fluticasone-Salmeterol 250-50 MCG/DOSE Aepb Commonly known as: ADVAIR Inhale 1 puff into the lungs every 12 (twelve) hours.   hydrochlorothiazide 25 MG tablet Commonly known as: HYDRODIURIL Take 1 tablet (25 mg total) by mouth daily. Please hold until you see your primary care doctor What changed: additional instructions   omeprazole 20 MG tablet Commonly known as: PRILOSEC OTC Take 20 mg by mouth 3 (three) times daily as needed (acid reflux).   predniSONE 20 MG tablet Commonly known as: DELTASONE Take 2 tablets (40 mg total) by mouth daily with breakfast for 4 days. Start taking on: February 17, 2022        Follow-up Information     Baxter Hire, MD. Schedule an appointment as soon as possible for a visit in 1 week(s).   Specialty: Internal Medicine Contact information: [place] [address] [phone]                Discharge Exam: Danley Danker Weights   02/15/22 1500  Weight: 44 kg   General.     In no acute  distress. Pulmonary.  Lungs clear bilaterally, normal respiratory effort. CV.  Regular rate and rhythm, no JVD, rub or murmur. Abdomen.  Soft, nontender, nondistended, BS positive. CNS.  Alert and oriented .  No focal neurologic deficit. Extremities.  No edema, no cyanosis, pulses intact and symmetrical. Psychiatry.  Judgment and insight appears normal.   Condition at discharge: stable  The results of significant diagnostics from this hospitalization (including imaging, microbiology, ancillary and laboratory) are listed below for reference.   Imaging Studies: CT Head Wo Contrast  Result Date: 02/15/2022 CLINICAL DATA:  Recent fall, confusion EXAM: CT HEAD WITHOUT CONTRAST TECHNIQUE: Contiguous axial images were obtained from the base of the skull through the vertex without intravenous contrast. RADIATION DOSE REDUCTION: This exam was performed according to the departmental dose-optimization program which includes automated exposure control, adjustment of the mA and/or kV according to patient size and/or use of iterative reconstruction technique. COMPARISON:  05/18/2007 FINDINGS: Brain: No evidence of acute infarction, hemorrhage, hydrocephalus, extra-axial collection or mass lesion/mass effect. Vascular: No hyperdense vessel or  unexpected calcification. Skull: Normal. Negative for fracture or focal lesion. Sinuses/Orbits: Paranasal sinuses are clear. Bilateral mastoidectomies. Other: None. IMPRESSION: No acute intracranial abnormality. Electronically Signed   By: Davina Poke D.O.   On: 02/15/2022 10:32   DG Knee Left Port  Result Date: 02/15/2022 CLINICAL DATA:  Left knee swelling after fall last week. EXAM: PORTABLE LEFT KNEE - 1-2 VIEW COMPARISON:  None Available. FINDINGS: No evidence of fracture, dislocation, or joint effusion. Chondrocalcinosis is noted medially and laterally, with moderate osteophyte formation seen medially. No significant joint space narrowing is noted. Calcifications  seen in suprapatella bursa region suggesting calcific bursitis. Soft tissues are unremarkable. IMPRESSION: Chondrocalcinosis is noted most consistent with early degenerative joint disease or possibly calcium pyrophosphate deposition disease. Moderate degenerative changes are noted medially with possible calcific bursitis it suprapatellar region. No acute abnormality is noted. Electronically Signed   By: Marijo Conception M.D.   On: 02/15/2022 10:28   DG Chest Port 1 View  Result Date: 02/15/2022 CLINICAL DATA:  Sepsis, fall. EXAM: PORTABLE CHEST 1 VIEW COMPARISON:  September 09, 2021. FINDINGS: The heart size and mediastinal contours are within normal limits. Both lungs are clear. The visualized skeletal structures are unremarkable. IMPRESSION: No active disease. Electronically Signed   By: Marijo Conception M.D.   On: 02/15/2022 10:25    Microbiology: Results for orders placed or performed during the hospital encounter of 02/15/22  Blood Culture (routine x 2)     Status: None (Preliminary result)   Collection Time: 02/15/22  9:58 AM   Specimen: BLOOD  Result Value Ref Range Status   Specimen Description BLOOD BLOOD LEFT ARM  Final   Special Requests   Final    BOTTLES DRAWN AEROBIC AND ANAEROBIC Blood Culture results may not be optimal due to an inadequate volume of blood received in culture bottles   Culture   Final    NO GROWTH < 24 HOURS Performed at [facility], [address] [place], [place] [address]    Report Status PENDING  Incomplete  Blood Culture (routine x 2)     Status: None (Preliminary result)   Collection Time: 02/15/22 10:03 AM   Specimen: BLOOD  Result Value Ref Range Status   Specimen Description BLOOD BLOOD RIGHT ARM  Final   Special Requests   Final    BOTTLES DRAWN AEROBIC AND ANAEROBIC Blood Culture results may not be optimal due to an excessive volume of blood received in culture bottles   Culture   Final    NO GROWTH < 24 HOURS Performed at [facility], [address], [place], [place] [address]    Report Status PENDING  Incomplete  Resp panel by RT-PCR (RSV, Flu A&B, Covid) Anterior Nasal Swab     Status: None   Collection Time: 02/15/22 10:50 AM   Specimen: Anterior Nasal Swab  Result Value Ref Range Status   SARS Coronavirus 2 by RT PCR NEGATIVE NEGATIVE Final    Comment: (NOTE) SARS-CoV-2 target nucleic acids are NOT DETECTED.  The SARS-CoV-2 RNA is generally detectable in upper respiratory specimens during the acute phase of infection. The lowest concentration of SARS-CoV-2 viral copies this assay can detect is 138 copies/mL. A negative result does not preclude SARS-Cov-2 infection and should not be used as the sole basis for treatment or other patient management decisions. A negative result may occur with  improper specimen collection/handling, submission of specimen other than nasopharyngeal swab, presence of viral mutation(s) within the areas  targeted by this assay, and inadequate number of viral copies(<138 copies/mL). A negative result must be combined with clinical observations, patient history, and epidemiological information. The expected result is Negative.  Fact Sheet for Patients:  EntrepreneurPulse.com.au  Fact Sheet for Healthcare Providers:  IncredibleEmployment.be  This test is no t yet approved or cleared by the [place] FDA and  has been authorized for detection and/or diagnosis of SARS-CoV-2 by FDA under an Emergency Use Authorization (EUA). This EUA will remain  in effect (meaning this test can be used) for the duration of the COVID-19 declaration under Section 564(b)(1) of the Act, 21 U.S.C.section 360bbb-3(b)(1), unless the authorization is terminated  or revoked sooner.       Influenza A by PCR NEGATIVE NEGATIVE Final   Influenza B by PCR NEGATIVE NEGATIVE Final    Comment: (NOTE) The Xpert Xpress SARS-CoV-2/FLU/RSV plus assay is intended as  an aid in the diagnosis of influenza from Nasopharyngeal swab specimens and should not be used as a sole basis for treatment. Nasal washings and aspirates are unacceptable for Xpert Xpress SARS-CoV-2/FLU/RSV testing.  Fact Sheet for Patients: EntrepreneurPulse.com.au  Fact Sheet for Healthcare Providers: IncredibleEmployment.be  This test is not yet approved or cleared by the [place] FDA and has been authorized for detection and/or diagnosis of SARS-CoV-2 by FDA under an Emergency Use Authorization (EUA). This EUA will remain in effect (meaning this test can be used) for the duration of the COVID-19 declaration under Section 564(b)(1) of the Act, 21 U.S.C. section 360bbb-3(b)(1), unless the authorization is terminated or revoked.     Resp Syncytial Virus by PCR NEGATIVE NEGATIVE Final    Comment: (NOTE) Fact Sheet for Patients: EntrepreneurPulse.com.au  Fact Sheet for Healthcare Providers: IncredibleEmployment.be  This test is not yet approved or cleared by the [place] FDA and has been authorized for detection and/or diagnosis of SARS-CoV-2 by FDA under an Emergency Use Authorization (EUA). This EUA will remain in effect (meaning this test can be used) for the duration of the COVID-19 declaration under Section 564(b)(1) of the Act, 21 U.S.C. section 360bbb-3(b)(1), unless the authorization is terminated or revoked.  Performed at [facility], [place], [place] [address]   Respiratory (~20 pathogens) panel by PCR     Status: None   Collection Time: 02/15/22 10:50 AM   Specimen: Nasopharyngeal Swab; Respiratory  Result Value Ref Range Status   Adenovirus NOT DETECTED NOT DETECTED Final   Coronavirus 229E NOT DETECTED NOT DETECTED Final    Comment: (NOTE) The Coronavirus on the Respiratory Panel, DOES NOT test for the novel  Coronavirus (2019 nCoV)    Coronavirus  HKU1 NOT DETECTED NOT DETECTED Final   Coronavirus NL63 NOT DETECTED NOT DETECTED Final   Coronavirus OC43 NOT DETECTED NOT DETECTED Final   Metapneumovirus NOT DETECTED NOT DETECTED Final   Rhinovirus / Enterovirus NOT DETECTED NOT DETECTED Final   Influenza A NOT DETECTED NOT DETECTED Final   Influenza B NOT DETECTED NOT DETECTED Final   Parainfluenza Virus 1 NOT DETECTED NOT DETECTED Final   Parainfluenza Virus 2 NOT DETECTED NOT DETECTED Final   Parainfluenza Virus 3 NOT DETECTED NOT DETECTED Final   Parainfluenza Virus 4 NOT DETECTED NOT DETECTED Final   Respiratory Syncytial Virus NOT DETECTED NOT DETECTED Final   Bordetella pertussis NOT DETECTED NOT DETECTED Final   Bordetella Parapertussis NOT DETECTED NOT DETECTED Final   Chlamydophila pneumoniae NOT DETECTED NOT DETECTED Final   Mycoplasma pneumoniae NOT DETECTED NOT DETECTED Final  Comment: Performed at [facility], [place] [address], [place], [place] [address]    Labs: CBC: Recent Labs  Lab 02/15/22 1005 02/16/22 0243  WBC 11.1* 8.4  NEUTROABS 10.2*  --   HGB 8.9* 8.6*  HCT 27.5* 26.1*  MCV 78.1* 75.9*  PLT 177 537   Basic Metabolic Panel: Recent Labs  Lab 02/15/22 1005 02/16/22 0243  NA 131* 135  K 3.3* 3.5  CL 99 105  CO2 26 26  GLUCOSE 97 108*  BUN 18 16  CREATININE 1.00 0.78  CALCIUM 8.8* 9.0   Liver Function Tests: Recent Labs  Lab 02/15/22 1005  AST 69*  ALT 52*  ALKPHOS 48  BILITOT 1.0  PROT 5.6*  ALBUMIN 2.7*   CBG: No results for input(s): "GLUCAP" in the last 168 hours.  Discharge time spent: greater than 30 minutes.  This record has been created using Systems analyst. Errors have been sought and corrected,but may not always be located. Such creation errors do not reflect on the standard of care.   Signed: Lorella Nimrod, MD [facility] 02/16/2022

## 2022-02-16 NOTE — Hospital Course (Addendum)
Close taken from H&P.  Crystal Bailey is a 72 y.o. female with medical history significant for hypertension, GERD, COPD, nicotine dependence who was brought into the ER by [facility] for evaluation of a fever at home. Per EMS she had a Tmax of 103.5, she was tachycardic with heart rates in the 120s and blood pressure 90/60.  She received 500 cc fluid bolus by EMS with improvement in her blood pressure. She has a cough productive of yellow phlegm but denies having any shortness of breath or wheezing. She states that she fell several days prior to her hospitalization and describes a mechanical fall where she tripped on a stump and hurt her left knee.  She did have some left knee swelling but that has improved and she denies having any pain.   Abnormal labs include lactate 2.7 >> 1.4, procalcitonin 1.32, sodium 131, potassium 3.3, white count 11.1, AST 69, ALT 52 Respiratory viral panel is negative Chest x-ray shows clear lungs Left knee x-ray was negative for any acute abnormality, did show chronic findings consistent with degenerative joint disease. She received a dose of cefepime in the ER and nebulizer treatment .  1/23: Afebrile this morning, on room air.  Respiratory viral panel negative.  Resolution of mild hyponatremia and hypokalemia.  Leukocytosis also resolved.  UA not consistent with UTI. Preliminary blood cultures negative.  Patient think that she is at her baseline and was asking to go home.  She is being discharged on 4 more days of prednisone and doxycycline.  She was also having borderline soft blood pressure, she was asked to hold HCTZ until she see her primary care doctor and they can restart if appropriate.  Patient will continue the rest of her home medications and need to have close follow-up with her providers for further recommendations.

## 2022-02-20 LAB — CULTURE, BLOOD (ROUTINE X 2)
Culture: NO GROWTH
Culture: NO GROWTH

## 2022-05-26 ENCOUNTER — Other Ambulatory Visit: Payer: Self-pay

## 2022-05-26 ENCOUNTER — Emergency Department
Admission: EM | Admit: 2022-05-26 | Discharge: 2022-05-26 | Disposition: A | Discharge status: Home / Self Care | Payer: Medicare Other | Attending: Emergency Medicine | Admitting: Emergency Medicine

## 2022-05-26 ENCOUNTER — Emergency Department: Payer: Medicare Other

## 2022-05-26 DIAGNOSIS — M545 Low back pain, unspecified: Secondary | ICD-10-CM | POA: Diagnosis present

## 2022-05-26 DIAGNOSIS — M25552 Pain in left hip: Secondary | ICD-10-CM | POA: Insufficient documentation

## 2022-05-26 DIAGNOSIS — W010XXA Fall on same level from slipping, tripping and stumbling without subsequent striking against object, initial encounter: Secondary | ICD-10-CM | POA: Diagnosis not present

## 2022-05-26 DIAGNOSIS — W19XXXA Unspecified fall, initial encounter: Secondary | ICD-10-CM

## 2022-05-26 DIAGNOSIS — J449 Chronic obstructive pulmonary disease, unspecified: Secondary | ICD-10-CM | POA: Diagnosis not present

## 2022-05-26 DIAGNOSIS — M7918 Myalgia, other site: Secondary | ICD-10-CM

## 2022-05-26 MED ORDER — LIDOCAINE 5 % EX PTCH: 1.0000 | MEDICATED_PATCH | Freq: Two times a day (BID) | CUTANEOUS | 0 refills | Status: AC

## 2022-05-26 MED ORDER — OXYCODONE HCL 5 MG PO TABS
5.0000 mg | ORAL_TABLET | Freq: Once | ORAL | Status: AC
  Administered 2022-05-26: 5 mg via ORAL
  Filled 2022-05-26: qty 1

## 2022-05-26 MED ORDER — ACETAMINOPHEN 500 MG PO TABS
1000.0000 mg | ORAL_TABLET | Freq: Once | ORAL | Status: AC
  Administered 2022-05-26: 1000 mg via ORAL
  Filled 2022-05-26: qty 2

## 2022-05-26 MED ORDER — LIDOCAINE 5 % EX PTCH
1.0000 | MEDICATED_PATCH | Freq: Once | CUTANEOUS | Status: DC
  Administered 2022-05-26: 1 via TRANSDERMAL
  Filled 2022-05-26: qty 1

## 2022-05-26 NOTE — Discharge Instructions (Signed)
No fracture on xray- Tylenol 1g every 8 hours for pain Lidocaine patches for pain.   Return to the ER for worsening pain, fevers or any other concerns

## 2022-05-26 NOTE — ED Provider Notes (Signed)
[facility] Provider Note    Event Date/Time   First MD Initiated Contact with Patient 05/26/22 1025     (approximate)   History   Fall   HPI  Crystal Bailey is a 72 y.o. female with COPD who comes in with concerns for a fall.  Patient reports mechanical fall in which she tripped on her dog and landed down on the ground.  She reports pain in her left hip, left back.  She states that she did not hit her head did not lose consciousness.  She denies any chest pain, shortness of breath.  She reports feeling at her baseline self is having pain on the left side.     Physical Exam   Triage Vital Signs: ED Triage Vitals [05/26/22 1029]  Enc Vitals Group     BP 126/69     Pulse Rate 80     Resp 20     Temp 98.4 F (36.9 C)     Temp Source Oral     SpO2 97 %     Weight      Height      Head Circumference      Peak Flow      Pain Score      Pain Loc      Pain Edu?      Excl. in GC?     Most recent vital signs: Vitals:   05/26/22 1029  BP: 126/69  Pulse: 80  Resp: 20  Temp: 98.4 F (36.9 C)  SpO2: 97%     General: Awake, no distress.  CV:  Good peripheral perfusion.  Resp:  Normal effort.  No chest wall tenderness Abd:  No distention.  No abdominal tenderness Other:  NO CTL spine tenderness.  Able to lift both legs up off the bed.  Good distal pulse.  Sensation intact.  Able to flex and extend the ankle.  Able to straight leg both legs. No hematoma felt to the head.    ED Results / Procedures / Treatments   Labs (all labs ordered are listed, but only abnormal results are displayed) Labs Reviewed - No data to display      RADIOLOGY I have reviewed the xray personally and interpreted no evidence of any fractures  PROCEDURES:  Critical Care performed: No  Procedures   MEDICATIONS ORDERED IN ED: Medications - No data to display   IMPRESSION / MDM / ASSESSMENT AND PLAN / ED COURSE  I reviewed the triage vital signs and the  nursing notes.   Patient's presentation is most consistent with acute, uncomplicated illness.   Patient comes in with mechanical fall.  I have asked her multiple times and she denies hitting her head.  She reports falling directly onto her buttocks, lower left back area with- no CTL spine tenderness. Denies syncope or prodromal symptoms to suggest needing EKG/labs given pt is at baseline otherwise and its mechanical fall. xrays were ordered out of precaution to ensure no fracture, dislocation.  Her pain seems to be more in her left lower buttock area.  X-rays were negative.  On repeat evaluation she has no L-spine tenderness do not feel like she needs CT imaging patient is ambulatory without assistance feeling much better.  We discussed Tylenol, lidocaine patches for pain and return to the ER if develops worsening symptoms or any other concerns     FINAL CLINICAL IMPRESSION(S) / ED DIAGNOSES   Final diagnoses:  Fall, initial encounter  Left  buttock pain     Rx / DC Orders   ED Discharge Orders          Ordered    lidocaine (LIDODERM) 5 %  Every 12 hours        05/26/22 1148             Note:  This document was prepared using Dragon voice recognition software and may include unintentional dictation errors.   Concha Se, MD 05/26/22 1149

## 2022-05-26 NOTE — ED Triage Notes (Signed)
Patient tripped over dog and fell landing on left side; complaining of pain to left hip and left lower back.

## 2022-08-15 ENCOUNTER — Other Ambulatory Visit: Payer: Self-pay | Admitting: Internal Medicine

## 2022-08-15 DIAGNOSIS — Z1231 Encounter for screening mammogram for malignant neoplasm of breast: Secondary | ICD-10-CM

## 2022-09-27 ENCOUNTER — Ambulatory Visit
Admission: RE | Admit: 2022-09-27 | Discharge: 2022-09-27 | Disposition: A | Discharge status: Home / Self Care | Payer: Medicare Other | Source: Ambulatory Visit | Attending: Internal Medicine | Admitting: Internal Medicine

## 2022-09-27 DIAGNOSIS — Z1231 Encounter for screening mammogram for malignant neoplasm of breast: Secondary | ICD-10-CM | POA: Insufficient documentation

## 2022-10-03 ENCOUNTER — Other Ambulatory Visit: Payer: Self-pay

## 2022-10-03 ENCOUNTER — Emergency Department: Payer: Medicare Other

## 2022-10-03 ENCOUNTER — Emergency Department
Admission: EM | Admit: 2022-10-03 | Discharge: 2022-10-03 | Disposition: A | Discharge status: Home / Self Care | Payer: Medicare Other | Attending: Emergency Medicine | Admitting: Emergency Medicine

## 2022-10-03 DIAGNOSIS — R1013 Epigastric pain: Secondary | ICD-10-CM | POA: Diagnosis present

## 2022-10-03 DIAGNOSIS — I10 Essential (primary) hypertension: Secondary | ICD-10-CM | POA: Diagnosis not present

## 2022-10-03 DIAGNOSIS — K859 Acute pancreatitis without necrosis or infection, unspecified: Secondary | ICD-10-CM | POA: Diagnosis not present

## 2022-10-03 LAB — BASIC METABOLIC PANEL
Anion gap: 8 (ref 5–15)
BUN: 19 mg/dL (ref 8–23)
CO2: 27 mmol/L (ref 22–32)
Calcium: 10.1 mg/dL (ref 8.9–10.3)
Chloride: 101 mmol/L (ref 98–111)
Creatinine, Ser: 0.64 mg/dL (ref 0.44–1.00)
GFR, Estimated: >60 mL/min (ref >60)
Glucose, Bld: 114 mg/dL — ABNORMAL HIGH (ref 70–99)
Potassium: 3.7 mmol/L (ref 3.5–5.1)
Sodium: 136 mmol/L (ref 135–145)

## 2022-10-03 LAB — HEPATIC FUNCTION PANEL
ALT: 78 U/L — ABNORMAL HIGH (ref 0–44)
AST: 150 U/L — ABNORMAL HIGH (ref 15–41)
Albumin: 3.5 g/dL (ref 3.5–5.0)
Alkaline Phosphatase: 48 U/L (ref 38–126)
Bilirubin, Direct: 0.1 mg/dL (ref 0.0–0.2)
Indirect Bilirubin: 0.4 mg/dL (ref 0.3–0.9)
Total Bilirubin: 0.5 mg/dL (ref 0.3–1.2)
Total Protein: 6.3 g/dL — ABNORMAL LOW (ref 6.5–8.1)

## 2022-10-03 LAB — CBC
HCT: 32.3 % — ABNORMAL LOW (ref 36.0–46.0)
Hemoglobin: 10.3 g/dL — ABNORMAL LOW (ref 12.0–15.0)
MCH: 25.3 pg — ABNORMAL LOW (ref 26.0–34.0)
MCHC: 31.9 g/dL (ref 30.0–36.0)
MCV: 79.4 fL — ABNORMAL LOW (ref 80.0–100.0)
Platelets: 242 10*3/uL (ref 150–400)
RBC: 4.07 MIL/uL (ref 3.87–5.11)
RDW: 15.9 % — ABNORMAL HIGH (ref 11.5–15.5)
WBC: 4.5 10*3/uL (ref 4.0–10.5)
nRBC: 0 % (ref 0.0–0.2)

## 2022-10-03 LAB — TROPONIN I (HIGH SENSITIVITY)
Troponin I (High Sensitivity): 3 ng/L (ref <18)
Troponin I (High Sensitivity): 4 ng/L (ref <18)

## 2022-10-03 LAB — LIPASE, BLOOD: Lipase: 604 U/L — ABNORMAL HIGH (ref 11–51)

## 2022-10-03 MED ORDER — IOHEXOL 300 MG/ML  SOLN
80.0000 mL | Freq: Once | INTRAMUSCULAR | Status: AC | PRN
  Administered 2022-10-03: 80 mL via INTRAVENOUS

## 2022-10-03 NOTE — Discharge Instructions (Addendum)
You were seen in the emergency department today for your abdominal pain.  You are found to have evidence of pancreatitis.  Please keep to clear liquids for the first 1 to 2 days and then add on bland foods after that as you tolerate.  Please follow-up with your primary care provider in 1 week for reassessment.  Please return emergency department if you notice severe worsening abdominal pain or start developing vomiting.

## 2022-10-03 NOTE — ED Provider Notes (Signed)
[facility] Provider Note    Event Date/Time   First MD Initiated Contact with Patient 10/03/22 [phone]     (approximate)   History   Chest Pain   HPI Crystal Bailey is a 72 y.o. female with HTN, GERD, anxiety presenting today for epigastric pain.  Patient states this morning she had acute onset of upper abdominal pain that felt sharp and lasted approximately 30 minutes.  She stated it radiated upwards into her chest as well as into the right side of her abdomen.  By the time EMS arrived, pain symptoms have resolved.  Currently denying any symptoms.  At that time, denied having chest pain, fever, chills, shortness of breath, nausea, vomiting, dysuria.  Prior history of pancreatitis.     Physical Exam   Triage Vital Signs: ED Triage Vitals  Encounter Vitals Group     BP 10/03/22 0900 126/71     Systolic BP Percentile --      Diastolic BP Percentile --      Pulse Rate 10/03/22 0900 (!) 49     Resp 10/03/22 0900 15     Temp 10/03/22 0905 97.8 F (36.6 C)     Temp Source 10/03/22 0905 Oral     SpO2 10/03/22 0900 100 %     Weight --      Height --      Head Circumference --      Peak Flow --      Pain Score 10/03/22 0856 0     Pain Loc --      Pain Education --      Exclude from Growth Chart --     Most recent vital signs: Vitals:   10/03/22 1000 10/03/22 1100  BP: (!) 158/70 129/60  Pulse: (!) 53 (!) 59  Resp:  19  Temp:    SpO2: 100% 100%   Physical Exam: I have reviewed the vital signs and nursing notes. General: Awake, alert, no acute distress.  Nontoxic appearing. Head:  Atraumatic, normocephalic.   ENT:  EOM intact, PERRL. Oral mucosa is pink and moist with no lesions. Neck: Neck is supple with full range of motion, No meningeal signs. Cardiovascular:  RRR, No murmurs. Peripheral pulses palpable and equal bilaterally. Respiratory:  Symmetrical chest wall expansion.  No rhonchi, rales, or wheezes.  Good air movement throughout.  No use  of accessory muscles.   Musculoskeletal:  No cyanosis or edema. Moving extremities with full ROM Abdomen:  Soft, mild tenderness palpation in epigastric and right upper quadrant, nondistended. Neuro:  GCS 15, moving all four extremities, interacting appropriately. Speech clear. Psych:  Calm, appropriate.   Skin:  Warm, dry, no rash.    ED Results / Procedures / Treatments   Labs (all labs ordered are listed, but only abnormal results are displayed) Labs Reviewed  BASIC METABOLIC PANEL - Abnormal; Notable for the following components:      Result Value   Glucose, Bld 114 (*)    All other components within normal limits  CBC - Abnormal; Notable for the following components:   Hemoglobin 10.3 (*)    HCT 32.3 (*)    MCV 79.4 (*)    MCH 25.3 (*)    RDW 15.9 (*)    All other components within normal limits  HEPATIC FUNCTION PANEL - Abnormal; Notable for the following components:   Total Protein 6.3 (*)    AST 150 (*)    ALT 78 (*)    All  other components within normal limits  LIPASE, BLOOD - Abnormal; Notable for the following components:   Lipase 604 (*)    All other components within normal limits  TROPONIN I (HIGH SENSITIVITY)  TROPONIN I (HIGH SENSITIVITY)     EKG My EKG interpretation: Rate of 47, sinus bradycardia, normal axis.  Normal intervals.  No acute ST elevations or depressions  Overall comparable to EKG on 02/15/2022.   RADIOLOGY Independently interpreted CT abdomen/pelvis with no acute pathology.   PROCEDURES:  Critical Care performed: No  Procedures   MEDICATIONS ORDERED IN ED: Medications  iohexol (OMNIPAQUE) 300 MG/ML solution 80 mL (80 mLs Intravenous Contrast Given 10/03/22 1035)     IMPRESSION / MDM / ASSESSMENT AND PLAN / ED COURSE  I reviewed the triage vital signs and the nursing notes.                              Differential diagnosis includes, but is not limited to, pancreatitis, ACS, pneumonia, GERD, cholecystitis, SBO.  Patient's  presentation is most consistent with acute presentation with potential threat to life or bodily function.  Patient is a 72 year old female presenting for upper abdominal pain.  Pain symptoms resolved on time of arrival but still has mild tenderness to palpation epigastric region.  Reported history of pancreatitis in the past and concern for similar today.  EKG without signs of ischemia.  Troponins negative x 2 with no concern for ACS at this time.  Lipase is markedly elevated at 604 but 1 year ago was elevated at 2400.  CT abdomen/pelvis shows no acute pathology.  Currently suspecting possible acute on chronic lipase with unsure level of her baseline lipase.  Mildly elevated LFTs but has similar instances in the past as well without severe change at this time.  Reassessed patient with no pain symptoms at this time.  Discussed with her likeliness of acute on chronic pancreatitis as her symptoms today.  Patient is adamant that she does not want to stay in the hospital as she has no pain or vomiting.  She was given p.o. fluids and crackers without issue here.  Patient will be discharged at this time and given strict return precautions.  Already has planned follow-up with her PCP in 8 days.    The patient is on the cardiac monitor to evaluate for evidence of arrhythmia and/or significant heart rate changes. Clinical Course as of 10/03/22 1150  Mon Oct 03, 2022  0914 CBC(!) Anemia consistent with prior levels.  Otherwise unremarkable [DW]  0946 Troponin I (High Sensitivity): 3 [DW]  0947 Basic metabolic panel(!) Largely unremarkable [DW]  1050 Hepatic function panel(!) Mild elevation in transaminases but prior history of this as well.  No elevation in T. bili [DW]  1131 Lipase(!): 604 Evidence of pancreatitis at this time but higher levels in the past.  Suspect this is because of her acute symptoms at this time. [DW]  1148 Reassessed patient's pain symptoms.  Denies any pain at this time.  Discussed her  elevated lipase level and likelihood of acute pancreatitis at this time.  Patient does not want to stay in the hospital as she has no pain or vomiting symptoms at this time.  She was given p.o. fluids and crackers and tolerated these without issue.  Patient will be discharged at this time and follow-up with her PCP in 1 week for reassessment. [DW]    Clinical Course User Index [DW] Janith Lima,  MD     FINAL CLINICAL IMPRESSION(S) / ED DIAGNOSES   Final diagnoses:  Acute pancreatitis, unspecified complication status, unspecified pancreatitis type     Rx / DC Orders   ED Discharge Orders     None        Note:  This document was prepared using Dragon voice recognition software and may include unintentional dictation errors.   Janith Lima, MD 10/03/22 [phone]

## 2022-10-03 NOTE — ED Notes (Signed)
Pt d/c home with family member per MD order. Discharge summary reviewed, verbalize understanding. Off unit via WC- d/c home with family member.

## 2022-10-03 NOTE — ED Notes (Signed)
Daughter at bedside. States pt frequently smokes and falls asleep with cigarettes in mouth which has caused pt CP in the past.

## 2022-10-03 NOTE — ED Notes (Signed)
Pt provided water and crackers for PO challenge  

## 2022-10-03 NOTE — ED Triage Notes (Signed)
Pt from home via [facility] for severe mid chest pain. EMS gave 324 mg of aspirin. Pt denies CP now.  EMS vitals: BP 130/82 HR 52 NSR 98% RA

## 2022-10-03 NOTE — Group Note (Deleted)

## 2022-10-03 NOTE — ED Notes (Signed)
EDP at bedside  

## 2023-03-20 ENCOUNTER — Other Ambulatory Visit: Payer: Self-pay

## 2023-03-20 ENCOUNTER — Emergency Department
Admission: EM | Admit: 2023-03-20 | Discharge: 2023-03-20 | Disposition: A | Discharge status: Home / Self Care | Payer: Medicare Other | Attending: Emergency Medicine | Admitting: Emergency Medicine

## 2023-03-20 ENCOUNTER — Emergency Department: Payer: Medicare Other

## 2023-03-20 DIAGNOSIS — J45909 Unspecified asthma, uncomplicated: Secondary | ICD-10-CM | POA: Diagnosis not present

## 2023-03-20 DIAGNOSIS — E876 Hypokalemia: Secondary | ICD-10-CM | POA: Diagnosis not present

## 2023-03-20 DIAGNOSIS — J441 Chronic obstructive pulmonary disease with (acute) exacerbation: Secondary | ICD-10-CM | POA: Insufficient documentation

## 2023-03-20 DIAGNOSIS — I10 Essential (primary) hypertension: Secondary | ICD-10-CM | POA: Insufficient documentation

## 2023-03-20 DIAGNOSIS — R0602 Shortness of breath: Secondary | ICD-10-CM | POA: Diagnosis present

## 2023-03-20 DIAGNOSIS — F17200 Nicotine dependence, unspecified, uncomplicated: Secondary | ICD-10-CM | POA: Insufficient documentation

## 2023-03-20 LAB — CBC WITH DIFFERENTIAL/PLATELET
Abs Immature Granulocytes: 0.01 10*3/uL (ref 0.00–0.07)
Basophils Absolute: 0.1 10*3/uL (ref 0.0–0.1)
Basophils Relative: 1 %
Eosinophils Absolute: 0.5 10*3/uL (ref 0.0–0.5)
Eosinophils Relative: 10 %
HCT: 27.6 % — ABNORMAL LOW (ref 36.0–46.0)
Hemoglobin: 8.7 g/dL — ABNORMAL LOW (ref 12.0–15.0)
Immature Granulocytes: 0 %
Lymphocytes Relative: 33 %
Lymphs Abs: 1.8 10*3/uL (ref 0.7–4.0)
MCH: 25.1 pg — ABNORMAL LOW (ref 26.0–34.0)
MCHC: 31.5 g/dL (ref 30.0–36.0)
MCV: 79.8 fL — ABNORMAL LOW (ref 80.0–100.0)
Monocytes Absolute: 0.7 10*3/uL (ref 0.1–1.0)
Monocytes Relative: 13 %
Neutro Abs: 2.4 10*3/uL (ref 1.7–7.7)
Neutrophils Relative %: 43 %
Platelets: 290 10*3/uL (ref 150–400)
RBC: 3.46 MIL/uL — ABNORMAL LOW (ref 3.87–5.11)
RDW: 15.8 % — ABNORMAL HIGH (ref 11.5–15.5)
WBC: 5.4 10*3/uL (ref 4.0–10.5)
nRBC: 0 % (ref 0.0–0.2)

## 2023-03-20 LAB — COMPREHENSIVE METABOLIC PANEL
ALT: 16 U/L (ref 0–44)
AST: 30 U/L (ref 15–41)
Albumin: 3.4 g/dL — ABNORMAL LOW (ref 3.5–5.0)
Alkaline Phosphatase: 53 U/L (ref 38–126)
Anion gap: 12 (ref 5–15)
BUN: 17 mg/dL (ref 8–23)
CO2: 27 mmol/L (ref 22–32)
Calcium: 10.2 mg/dL (ref 8.9–10.3)
Chloride: 97 mmol/L — ABNORMAL LOW (ref 98–111)
Creatinine, Ser: 0.74 mg/dL (ref 0.44–1.00)
GFR, Estimated: >60 mL/min (ref >60)
Glucose, Bld: 107 mg/dL — ABNORMAL HIGH (ref 70–99)
Potassium: 2.7 mmol/L — CL (ref 3.5–5.1)
Sodium: 136 mmol/L (ref 135–145)
Total Bilirubin: 0.4 mg/dL (ref 0.0–1.2)
Total Protein: 6.3 g/dL — ABNORMAL LOW (ref 6.5–8.1)

## 2023-03-20 LAB — TROPONIN I (HIGH SENSITIVITY)
Troponin I (High Sensitivity): 6 ng/L (ref <18)
Troponin I (High Sensitivity): 18 ng/L — ABNORMAL HIGH (ref <18)
Troponin I (High Sensitivity): 5 ng/L (ref <18)

## 2023-03-20 LAB — RESP PANEL BY RT-PCR (RSV, FLU A&B, COVID)  RVPGX2
Influenza A by PCR: NEGATIVE
Influenza B by PCR: NEGATIVE
Resp Syncytial Virus by PCR: NEGATIVE
SARS Coronavirus 2 by RT PCR: NEGATIVE

## 2023-03-20 LAB — BLOOD GAS, VENOUS

## 2023-03-20 LAB — CARBOXYHEMOGLOBIN - COOX: Carboxyhemoglobin: 0.7 % (ref 0.5–1.5)

## 2023-03-20 MED ORDER — PREDNISONE 20 MG PO TABS
40.0000 mg | ORAL_TABLET | Freq: Once | ORAL | Status: AC
Start: 2023-03-20 — End: 2023-03-20
  Administered 2023-03-20: 40 mg via ORAL
  Filled 2023-03-20: qty 2

## 2023-03-20 MED ORDER — IPRATROPIUM-ALBUTEROL 0.5-2.5 (3) MG/3ML IN SOLN
6.0000 mL | Freq: Once | RESPIRATORY_TRACT | Status: AC
  Administered 2023-03-20: 6 mL via RESPIRATORY_TRACT
  Filled 2023-03-20: qty 3

## 2023-03-20 MED ORDER — POTASSIUM CHLORIDE CRYS ER 20 MEQ PO TBCR
40.0000 meq | EXTENDED_RELEASE_TABLET | Freq: Once | ORAL | Status: AC
  Administered 2023-03-20: 40 meq via ORAL
  Filled 2023-03-20: qty 2

## 2023-03-20 MED ORDER — PREDNISONE 20 MG PO TABS: 40.0000 mg | ORAL_TABLET | Freq: Every day | ORAL | 0 refills | Status: AC

## 2023-03-20 MED ORDER — DOXYCYCLINE HYCLATE 100 MG PO TABS
100.0000 mg | ORAL_TABLET | Freq: Once | ORAL | Status: AC
  Administered 2023-03-20: 100 mg via ORAL
  Filled 2023-03-20: qty 1

## 2023-03-20 MED ORDER — DOXYCYCLINE HYCLATE 100 MG PO TABS: 100.0000 mg | ORAL_TABLET | Freq: Two times a day (BID) | ORAL | 0 refills | Status: AC

## 2023-03-20 NOTE — Discharge Instructions (Signed)
 Please take the antibiotics as prescribed for your COPD exacerbation.

## 2023-03-20 NOTE — ED Triage Notes (Signed)
 BIBA for c/o SOB, hx asthma, does not have inhaler. Occasionally smokes. Denies CP Received 1 duo neb and 1 albuterol treatment. Pt speaking in clear sentences. O2 100% on RA.

## 2023-03-20 NOTE — ED Provider Notes (Addendum)
 [facility] Provider Note    Event Date/Time   First MD Initiated Contact with Patient 03/20/23 0031     (approximate)   History   Shortness of Breath   HPI  Crystal Bailey is a 73 y.o. female with history of asthma, smoker, anxiety, hypertension, presenting with shortness of breath and wheezing.  EMS they found her short of breath, wheezing bilaterally, 90% on room air.  Gave her 1 DuoNeb and 1 albuterol with some improvement to the her breathing.  She has no chest pain.  Denies cough.  States that she ran out of inhalers 24 hours ago.  Per EMS she also smells a little bit of kerosene because she has a space heater at home that she has been using.  She has no headaches, leg swelling.  No history of CHF.  On independent chart review, she was seen by primary care doctor in September last year, does have some memory difficulties.     Physical Exam   Triage Vital Signs: ED Triage Vitals  Encounter Vitals Group     BP      Systolic BP Percentile      Diastolic BP Percentile      Pulse      Resp      Temp      Temp src      SpO2      Weight      Height      Head Circumference      Peak Flow      Pain Score      Pain Loc      Pain Education      Exclude from Growth Chart     Most recent vital signs: Vitals:   03/20/23 0437 03/20/23 0500  BP:  (!) 108/49  Pulse:  (!) 56  Resp:  18  Temp: 98.3 F (36.8 C)   SpO2:  100%     General: Awake, no distress.  CV:  Good peripheral perfusion.  Resp:  Normal effort.  Bilateral expiratory wheezing, she is tachypneic. Abd:  No distention.  Soft nontender Other:  No lower extremity edema.   ED Results / Procedures / Treatments   Labs (all labs ordered are listed, but only abnormal results are displayed) Labs Reviewed  BLOOD GAS, VENOUS - Abnormal; Notable for the following components:      Result Value   Bicarbonate 32.7 (*)    Acid-Base Excess 6.2 (*)    All other components within normal  limits  CBC WITH DIFFERENTIAL/PLATELET - Abnormal; Notable for the following components:   RBC 3.46 (*)    Hemoglobin 8.7 (*)    HCT 27.6 (*)    MCV 79.8 (*)    MCH 25.1 (*)    RDW 15.8 (*)    All other components within normal limits  COMPREHENSIVE METABOLIC PANEL - Abnormal; Notable for the following components:   Potassium 2.7 (*)    Chloride 97 (*)    Glucose, Bld 107 (*)    Total Protein 6.3 (*)    Albumin 3.4 (*)    All other components within normal limits  TROPONIN I (HIGH SENSITIVITY) - Abnormal; Notable for the following components:   Troponin I (High Sensitivity) 18 (*)    All other components within normal limits  RESP PANEL BY RT-PCR (RSV, FLU A&B, COVID)  [id number]  CARBOXYHEMOGLOBIN - COOX  CBC WITH DIFFERENTIAL/PLATELET  CARBOXYHEMOGLOBIN - COOX  TROPONIN I (HIGH SENSITIVITY)  TROPONIN I (HIGH SENSITIVITY)  TROPONIN I (HIGH SENSITIVITY)     EKG  EKG shows sinus rhythm, rate 60, widened QRS, normal QTc, intraventricular conduction delay, no ischemic ST elevation, T wave flattening in 3, V3, not significant change compared to prior   RADIOLOGY Chest x-ray on my interpretation without focal consolidation   PROCEDURES:  Critical Care performed: No  Procedures   MEDICATIONS ORDERED IN ED: Medications  ipratropium-albuterol (DUONEB) 0.5-2.5 (3) MG/3ML nebulizer solution 6 mL (6 mLs Nebulization Given 03/20/23 0047)  predniSONE (DELTASONE) tablet 40 mg (40 mg Oral Given 03/20/23 0047)  doxycycline (VIBRA-TABS) tablet 100 mg (100 mg Oral Given 03/20/23 0047)  potassium chloride SA (KLOR-CON M) CR tablet 40 mEq (40 mEq Oral Given 03/20/23 0141)     IMPRESSION / MDM / ASSESSMENT AND PLAN / ED COURSE  I reviewed the triage vital signs and the nursing notes.                              Differential diagnosis includes, but is not limited to, asthma exacerbation, COPD exacerbation given her smoking history, viral illness, pneumonia, ACS, no evidence of volume  overload, considered but doubt CHF.  Considered PE but patient is wheezing, more likely to be asthma or COPD exacerbation.  Given that EMS said that she has been using space heaters at home, will also get a carboxyhemoglobin level.  Will get labs, EKG, troponin, chest x-ray, will give her 2 additional DuoNebs as well as prednisone and doxycycline for presumed COPD exacerbation.  Patient's presentation is most consistent with acute presentation with potential threat to life or bodily function.  Independent review of labs as well as clinical course are below.  Repeat troponin down trended to normal.  She is otherwise asymptomatic.  Considered but no indication for inpatient admission at this time, she is safe for outpatient management.  Will give her 4 more days of prednisone as well as a prescription for doxycycline for COPD exacerbation.  Will discharge with strict turn precautions.  Short-term follow-up with primary care doctor for further management of her symptoms.  Clinical Course as of 03/20/23 0601  Mon Mar 20, 2023  0225 Independent review of labs, no leukocytosis, her initial troponin is negative, carboxyhemoglobin is not elevated, her potassium is 2.7, will give her some potassium here.  Creatinine is normal, LFTs are normal, rest of electrolytes are not severely deranged. [TT]  0225 VBG pH and pCO2 is normal. [TT]  0226 DG Chest Portable 1 View IMPRESSION: No acute cardiopulmonary disease.   [TT]  0259 On reassessment patient is feeling a lot better, clear lung sounds.  Eating in the room. [TT]  [id number] Troponin I (High Sensitivity)(!): 18 Mildly elevated.  Patient has no chest pain at this time.  Shortness of breath and wheezing is improved. [TT]    Clinical Course User Index [TT] Claybon Jabs, MD     FINAL CLINICAL IMPRESSION(S) / ED DIAGNOSES   Final diagnoses:  COPD exacerbation (HCC)  Hypokalemia     Rx / DC Orders   ED Discharge Orders          Ordered    predniSONE  (DELTASONE) 20 MG tablet  Daily with breakfast        03/20/23 0600    doxycycline (VIBRA-TABS) 100 MG tablet  2 times daily        03/20/23 0600  Note:  This document was prepared using Dragon voice recognition software and may include unintentional dictation errors.    Claybon Jabs, MD 03/20/23 0600    Claybon Jabs, MD 03/20/23 [phone]

## 2023-03-20 NOTE — ED Notes (Signed)
 Pt ambulates to BR w/ steady gait to urinate.

## 2023-03-20 NOTE — ED Notes (Signed)
 Patient requesting a wheelchair to wait out front for her daughter to arrive.

## 2023-03-20 NOTE — ED Notes (Signed)
 Pt daughter updated on care.

## 2023-03-20 NOTE — ED Notes (Signed)
 This NT provided PT with a sandwich tray and graham crackers.

## 2023-03-21 LAB — BLOOD GAS, VENOUS
Bicarbonate: 32.7 mmol/L — ABNORMAL HIGH (ref 20.0–28.0)
O2 Saturation: 22.8 mmol/L — ABNORMAL HIGH (ref 0.0–2.0)
Patient temperature: 37
Patient temperature: 37 %
pCO2, Ven: 54 mmHg (ref 44–60)
pH, Ven: 7.39 (ref 7.25–7.43)
pO2, Ven: 32.7 mmol/L — ABNORMAL HIGH (ref 32–45)

## 2023-08-18 ENCOUNTER — Other Ambulatory Visit: Payer: Self-pay | Admitting: Internal Medicine

## 2023-08-18 DIAGNOSIS — Z1231 Encounter for screening mammogram for malignant neoplasm of breast: Secondary | ICD-10-CM

## 2023-09-28 ENCOUNTER — Ambulatory Visit
Admission: RE | Admit: 2023-09-28 | Discharge: 2023-09-28 | Disposition: A | Discharge status: Home / Self Care | Source: Ambulatory Visit | Attending: Internal Medicine | Admitting: Internal Medicine

## 2023-09-28 DIAGNOSIS — Z1231 Encounter for screening mammogram for malignant neoplasm of breast: Secondary | ICD-10-CM | POA: Diagnosis present
